# Patient Record
Sex: Male | Born: 1945 | Race: White | Hispanic: No | Marital: Married | Smoking: Current every day smoker
Health system: Southern US, Community
[De-identification: ages and names within clinical notes are randomized; demographics above are authoritative.]

## PROBLEM LIST (undated history)

## (undated) ENCOUNTER — Emergency Department (HOSPITAL_COMMUNITY): Admission: EM | Payer: Self-pay | Source: Home / Self Care

## (undated) DIAGNOSIS — R0789 Other chest pain: Secondary | ICD-10-CM

## (undated) DIAGNOSIS — J9601 Acute respiratory failure with hypoxia: Secondary | ICD-10-CM

## (undated) DIAGNOSIS — I10 Essential (primary) hypertension: Secondary | ICD-10-CM

## (undated) DIAGNOSIS — I2510 Atherosclerotic heart disease of native coronary artery without angina pectoris: Secondary | ICD-10-CM

## (undated) DIAGNOSIS — I509 Heart failure, unspecified: Secondary | ICD-10-CM

## (undated) DIAGNOSIS — F10930 Alcohol use, unspecified with withdrawal, uncomplicated: Secondary | ICD-10-CM

## (undated) DIAGNOSIS — J449 Chronic obstructive pulmonary disease, unspecified: Secondary | ICD-10-CM

## (undated) DIAGNOSIS — J441 Chronic obstructive pulmonary disease with (acute) exacerbation: Secondary | ICD-10-CM

## (undated) DIAGNOSIS — R140 Abdominal distension (gaseous): Secondary | ICD-10-CM

## (undated) DIAGNOSIS — R6510 Systemic inflammatory response syndrome (SIRS) of non-infectious origin without acute organ dysfunction: Secondary | ICD-10-CM

## (undated) DIAGNOSIS — F10230 Alcohol dependence with withdrawal, uncomplicated: Secondary | ICD-10-CM

## (undated) HISTORY — DX: Alcohol use, unspecified with withdrawal, uncomplicated: F10.930

## (undated) HISTORY — DX: Essential (primary) hypertension: I10

## (undated) HISTORY — DX: Systemic inflammatory response syndrome (sirs) of non-infectious origin without acute organ dysfunction: R65.10

## (undated) HISTORY — DX: Atherosclerotic heart disease of native coronary artery without angina pectoris: I25.10

## (undated) HISTORY — PX: CYST REMOVAL NECK: SHX6281

## (undated) HISTORY — DX: Alcohol dependence with withdrawal, uncomplicated: F10.230

## (undated) HISTORY — DX: Chronic obstructive pulmonary disease with (acute) exacerbation: J44.1

## (undated) HISTORY — DX: Abdominal distension (gaseous): R14.0

## (undated) HISTORY — DX: Other chest pain: R07.89

## (undated) HISTORY — DX: Acute respiratory failure with hypoxia: J96.01

---

## 2011-07-22 DIAGNOSIS — R05 Cough: Secondary | ICD-10-CM | POA: Diagnosis not present

## 2011-07-22 DIAGNOSIS — J209 Acute bronchitis, unspecified: Secondary | ICD-10-CM | POA: Diagnosis not present

## 2011-07-22 DIAGNOSIS — J189 Pneumonia, unspecified organism: Secondary | ICD-10-CM | POA: Diagnosis not present

## 2011-10-19 DIAGNOSIS — J4489 Other specified chronic obstructive pulmonary disease: Secondary | ICD-10-CM | POA: Diagnosis not present

## 2011-10-19 DIAGNOSIS — R220 Localized swelling, mass and lump, head: Secondary | ICD-10-CM | POA: Diagnosis not present

## 2011-10-19 DIAGNOSIS — J449 Chronic obstructive pulmonary disease, unspecified: Secondary | ICD-10-CM | POA: Diagnosis not present

## 2011-10-19 DIAGNOSIS — F17200 Nicotine dependence, unspecified, uncomplicated: Secondary | ICD-10-CM | POA: Diagnosis not present

## 2011-10-21 DIAGNOSIS — F17200 Nicotine dependence, unspecified, uncomplicated: Secondary | ICD-10-CM | POA: Diagnosis not present

## 2011-10-21 DIAGNOSIS — R221 Localized swelling, mass and lump, neck: Secondary | ICD-10-CM | POA: Diagnosis not present

## 2011-10-21 DIAGNOSIS — R220 Localized swelling, mass and lump, head: Secondary | ICD-10-CM | POA: Diagnosis not present

## 2011-10-21 DIAGNOSIS — J449 Chronic obstructive pulmonary disease, unspecified: Secondary | ICD-10-CM | POA: Diagnosis not present

## 2011-10-28 DIAGNOSIS — R221 Localized swelling, mass and lump, neck: Secondary | ICD-10-CM | POA: Diagnosis not present

## 2011-10-28 DIAGNOSIS — M5030 Other cervical disc degeneration, unspecified cervical region: Secondary | ICD-10-CM | POA: Diagnosis not present

## 2011-10-28 DIAGNOSIS — R220 Localized swelling, mass and lump, head: Secondary | ICD-10-CM | POA: Diagnosis not present

## 2011-10-28 DIAGNOSIS — M47812 Spondylosis without myelopathy or radiculopathy, cervical region: Secondary | ICD-10-CM | POA: Diagnosis not present

## 2011-11-10 DIAGNOSIS — D1739 Benign lipomatous neoplasm of skin and subcutaneous tissue of other sites: Secondary | ICD-10-CM | POA: Diagnosis not present

## 2011-12-07 DIAGNOSIS — G8911 Acute pain due to trauma: Secondary | ICD-10-CM | POA: Diagnosis not present

## 2011-12-07 DIAGNOSIS — IMO0002 Reserved for concepts with insufficient information to code with codable children: Secondary | ICD-10-CM | POA: Diagnosis not present

## 2011-12-07 DIAGNOSIS — F17200 Nicotine dependence, unspecified, uncomplicated: Secondary | ICD-10-CM | POA: Diagnosis not present

## 2011-12-07 DIAGNOSIS — S139XXA Sprain of joints and ligaments of unspecified parts of neck, initial encounter: Secondary | ICD-10-CM | POA: Diagnosis not present

## 2011-12-14 DIAGNOSIS — R209 Unspecified disturbances of skin sensation: Secondary | ICD-10-CM | POA: Diagnosis not present

## 2011-12-14 DIAGNOSIS — J449 Chronic obstructive pulmonary disease, unspecified: Secondary | ICD-10-CM | POA: Diagnosis not present

## 2011-12-16 DIAGNOSIS — IMO0002 Reserved for concepts with insufficient information to code with codable children: Secondary | ICD-10-CM | POA: Diagnosis not present

## 2011-12-16 DIAGNOSIS — M62838 Other muscle spasm: Secondary | ICD-10-CM | POA: Diagnosis not present

## 2011-12-30 DIAGNOSIS — IMO0002 Reserved for concepts with insufficient information to code with codable children: Secondary | ICD-10-CM | POA: Diagnosis not present

## 2011-12-30 DIAGNOSIS — M62838 Other muscle spasm: Secondary | ICD-10-CM | POA: Diagnosis not present

## 2012-01-05 DIAGNOSIS — IMO0002 Reserved for concepts with insufficient information to code with codable children: Secondary | ICD-10-CM | POA: Diagnosis not present

## 2012-01-05 DIAGNOSIS — M62838 Other muscle spasm: Secondary | ICD-10-CM | POA: Diagnosis not present

## 2012-01-06 DIAGNOSIS — D1779 Benign lipomatous neoplasm of other sites: Secondary | ICD-10-CM | POA: Diagnosis not present

## 2012-01-06 DIAGNOSIS — D1739 Benign lipomatous neoplasm of skin and subcutaneous tissue of other sites: Secondary | ICD-10-CM | POA: Diagnosis not present

## 2012-01-06 DIAGNOSIS — F17200 Nicotine dependence, unspecified, uncomplicated: Secondary | ICD-10-CM | POA: Diagnosis not present

## 2012-01-06 DIAGNOSIS — J449 Chronic obstructive pulmonary disease, unspecified: Secondary | ICD-10-CM | POA: Diagnosis not present

## 2012-01-06 DIAGNOSIS — Z79899 Other long term (current) drug therapy: Secondary | ICD-10-CM | POA: Diagnosis not present

## 2012-01-13 DIAGNOSIS — IMO0002 Reserved for concepts with insufficient information to code with codable children: Secondary | ICD-10-CM | POA: Diagnosis not present

## 2012-01-13 DIAGNOSIS — M62838 Other muscle spasm: Secondary | ICD-10-CM | POA: Diagnosis not present

## 2012-01-20 DIAGNOSIS — IMO0002 Reserved for concepts with insufficient information to code with codable children: Secondary | ICD-10-CM | POA: Diagnosis not present

## 2012-01-20 DIAGNOSIS — M62838 Other muscle spasm: Secondary | ICD-10-CM | POA: Diagnosis not present

## 2012-01-25 DIAGNOSIS — M62838 Other muscle spasm: Secondary | ICD-10-CM | POA: Diagnosis not present

## 2012-01-25 DIAGNOSIS — IMO0002 Reserved for concepts with insufficient information to code with codable children: Secondary | ICD-10-CM | POA: Diagnosis not present

## 2012-02-01 DIAGNOSIS — M62838 Other muscle spasm: Secondary | ICD-10-CM | POA: Diagnosis not present

## 2012-02-01 DIAGNOSIS — IMO0002 Reserved for concepts with insufficient information to code with codable children: Secondary | ICD-10-CM | POA: Diagnosis not present

## 2013-01-16 DIAGNOSIS — Z0000 Encounter for general adult medical examination without abnormal findings: Secondary | ICD-10-CM | POA: Diagnosis not present

## 2013-02-28 DIAGNOSIS — H2510 Age-related nuclear cataract, unspecified eye: Secondary | ICD-10-CM | POA: Diagnosis not present

## 2013-06-01 DIAGNOSIS — J209 Acute bronchitis, unspecified: Secondary | ICD-10-CM | POA: Diagnosis not present

## 2013-07-22 DIAGNOSIS — J449 Chronic obstructive pulmonary disease, unspecified: Secondary | ICD-10-CM | POA: Diagnosis not present

## 2013-07-22 DIAGNOSIS — J189 Pneumonia, unspecified organism: Secondary | ICD-10-CM | POA: Diagnosis not present

## 2013-10-09 DIAGNOSIS — R0609 Other forms of dyspnea: Secondary | ICD-10-CM | POA: Diagnosis not present

## 2013-10-09 DIAGNOSIS — R059 Cough, unspecified: Secondary | ICD-10-CM | POA: Diagnosis not present

## 2013-10-09 DIAGNOSIS — R05 Cough: Secondary | ICD-10-CM | POA: Diagnosis not present

## 2013-11-22 DIAGNOSIS — F17200 Nicotine dependence, unspecified, uncomplicated: Secondary | ICD-10-CM | POA: Diagnosis not present

## 2013-11-22 DIAGNOSIS — J449 Chronic obstructive pulmonary disease, unspecified: Secondary | ICD-10-CM | POA: Diagnosis not present

## 2014-03-29 DIAGNOSIS — J209 Acute bronchitis, unspecified: Secondary | ICD-10-CM | POA: Diagnosis not present

## 2014-11-16 DIAGNOSIS — J45909 Unspecified asthma, uncomplicated: Secondary | ICD-10-CM | POA: Diagnosis not present

## 2014-11-16 DIAGNOSIS — J441 Chronic obstructive pulmonary disease with (acute) exacerbation: Secondary | ICD-10-CM | POA: Diagnosis not present

## 2014-11-16 DIAGNOSIS — R0602 Shortness of breath: Secondary | ICD-10-CM | POA: Diagnosis not present

## 2014-11-16 DIAGNOSIS — R079 Chest pain, unspecified: Secondary | ICD-10-CM | POA: Diagnosis not present

## 2014-11-16 DIAGNOSIS — Z77098 Contact with and (suspected) exposure to other hazardous, chiefly nonmedicinal, chemicals: Secondary | ICD-10-CM | POA: Diagnosis not present

## 2014-11-21 DIAGNOSIS — J9620 Acute and chronic respiratory failure, unspecified whether with hypoxia or hypercapnia: Secondary | ICD-10-CM | POA: Diagnosis not present

## 2014-11-21 DIAGNOSIS — J159 Unspecified bacterial pneumonia: Secondary | ICD-10-CM | POA: Diagnosis not present

## 2014-11-21 DIAGNOSIS — K219 Gastro-esophageal reflux disease without esophagitis: Secondary | ICD-10-CM | POA: Diagnosis not present

## 2014-11-21 DIAGNOSIS — R0689 Other abnormalities of breathing: Secondary | ICD-10-CM | POA: Diagnosis not present

## 2014-11-21 DIAGNOSIS — I2510 Atherosclerotic heart disease of native coronary artery without angina pectoris: Secondary | ICD-10-CM | POA: Diagnosis not present

## 2014-11-21 DIAGNOSIS — M1990 Unspecified osteoarthritis, unspecified site: Secondary | ICD-10-CM | POA: Diagnosis present

## 2014-11-21 DIAGNOSIS — R938 Abnormal findings on diagnostic imaging of other specified body structures: Secondary | ICD-10-CM | POA: Diagnosis not present

## 2014-11-21 DIAGNOSIS — Z87891 Personal history of nicotine dependence: Secondary | ICD-10-CM | POA: Diagnosis not present

## 2014-11-21 DIAGNOSIS — J181 Lobar pneumonia, unspecified organism: Secondary | ICD-10-CM | POA: Diagnosis not present

## 2014-11-21 DIAGNOSIS — J9811 Atelectasis: Secondary | ICD-10-CM | POA: Diagnosis not present

## 2014-11-21 DIAGNOSIS — I700 Atherosclerosis of aorta: Secondary | ICD-10-CM | POA: Diagnosis not present

## 2014-11-21 DIAGNOSIS — I10 Essential (primary) hypertension: Secondary | ICD-10-CM | POA: Diagnosis present

## 2014-11-21 DIAGNOSIS — R0602 Shortness of breath: Secondary | ICD-10-CM | POA: Diagnosis not present

## 2014-11-21 DIAGNOSIS — J9601 Acute respiratory failure with hypoxia: Secondary | ICD-10-CM | POA: Diagnosis not present

## 2014-11-21 DIAGNOSIS — I519 Heart disease, unspecified: Secondary | ICD-10-CM | POA: Diagnosis present

## 2014-11-21 DIAGNOSIS — J441 Chronic obstructive pulmonary disease with (acute) exacerbation: Secondary | ICD-10-CM | POA: Diagnosis not present

## 2014-11-21 DIAGNOSIS — R918 Other nonspecific abnormal finding of lung field: Secondary | ICD-10-CM | POA: Diagnosis not present

## 2014-11-21 DIAGNOSIS — J9809 Other diseases of bronchus, not elsewhere classified: Secondary | ICD-10-CM | POA: Diagnosis present

## 2014-11-21 DIAGNOSIS — R05 Cough: Secondary | ICD-10-CM | POA: Diagnosis not present

## 2014-11-21 DIAGNOSIS — R0902 Hypoxemia: Secondary | ICD-10-CM | POA: Diagnosis not present

## 2014-11-21 DIAGNOSIS — I739 Peripheral vascular disease, unspecified: Secondary | ICD-10-CM | POA: Diagnosis present

## 2014-11-21 DIAGNOSIS — Z79899 Other long term (current) drug therapy: Secondary | ICD-10-CM | POA: Diagnosis not present

## 2014-11-21 DIAGNOSIS — Z9981 Dependence on supplemental oxygen: Secondary | ICD-10-CM | POA: Diagnosis not present

## 2014-11-25 DIAGNOSIS — J9620 Acute and chronic respiratory failure, unspecified whether with hypoxia or hypercapnia: Secondary | ICD-10-CM | POA: Diagnosis not present

## 2014-11-25 DIAGNOSIS — J159 Unspecified bacterial pneumonia: Secondary | ICD-10-CM | POA: Diagnosis not present

## 2014-11-25 DIAGNOSIS — R938 Abnormal findings on diagnostic imaging of other specified body structures: Secondary | ICD-10-CM | POA: Diagnosis not present

## 2014-11-25 DIAGNOSIS — Z9981 Dependence on supplemental oxygen: Secondary | ICD-10-CM | POA: Diagnosis not present

## 2014-11-25 DIAGNOSIS — R0902 Hypoxemia: Secondary | ICD-10-CM | POA: Diagnosis not present

## 2014-11-25 DIAGNOSIS — J441 Chronic obstructive pulmonary disease with (acute) exacerbation: Secondary | ICD-10-CM | POA: Diagnosis not present

## 2014-11-25 DIAGNOSIS — J9601 Acute respiratory failure with hypoxia: Secondary | ICD-10-CM | POA: Diagnosis not present

## 2014-12-03 DIAGNOSIS — J449 Chronic obstructive pulmonary disease, unspecified: Secondary | ICD-10-CM | POA: Diagnosis not present

## 2014-12-03 DIAGNOSIS — F17210 Nicotine dependence, cigarettes, uncomplicated: Secondary | ICD-10-CM | POA: Diagnosis not present

## 2014-12-03 DIAGNOSIS — R918 Other nonspecific abnormal finding of lung field: Secondary | ICD-10-CM | POA: Diagnosis not present

## 2014-12-04 DIAGNOSIS — F17200 Nicotine dependence, unspecified, uncomplicated: Secondary | ICD-10-CM | POA: Diagnosis not present

## 2014-12-04 DIAGNOSIS — Z6827 Body mass index (BMI) 27.0-27.9, adult: Secondary | ICD-10-CM | POA: Diagnosis not present

## 2014-12-04 DIAGNOSIS — J441 Chronic obstructive pulmonary disease with (acute) exacerbation: Secondary | ICD-10-CM | POA: Diagnosis not present

## 2014-12-04 DIAGNOSIS — R938 Abnormal findings on diagnostic imaging of other specified body structures: Secondary | ICD-10-CM | POA: Diagnosis not present

## 2014-12-04 DIAGNOSIS — J159 Unspecified bacterial pneumonia: Secondary | ICD-10-CM | POA: Diagnosis not present

## 2014-12-04 DIAGNOSIS — I739 Peripheral vascular disease, unspecified: Secondary | ICD-10-CM | POA: Diagnosis not present

## 2014-12-04 DIAGNOSIS — K219 Gastro-esophageal reflux disease without esophagitis: Secondary | ICD-10-CM | POA: Diagnosis not present

## 2014-12-04 DIAGNOSIS — Z09 Encounter for follow-up examination after completed treatment for conditions other than malignant neoplasm: Secondary | ICD-10-CM | POA: Diagnosis not present

## 2014-12-15 DIAGNOSIS — I2510 Atherosclerotic heart disease of native coronary artery without angina pectoris: Secondary | ICD-10-CM | POA: Diagnosis not present

## 2014-12-15 DIAGNOSIS — R911 Solitary pulmonary nodule: Secondary | ICD-10-CM | POA: Diagnosis not present

## 2014-12-15 DIAGNOSIS — J432 Centrilobular emphysema: Secondary | ICD-10-CM | POA: Diagnosis not present

## 2014-12-15 DIAGNOSIS — J9809 Other diseases of bronchus, not elsewhere classified: Secondary | ICD-10-CM | POA: Diagnosis not present

## 2014-12-15 DIAGNOSIS — J439 Emphysema, unspecified: Secondary | ICD-10-CM | POA: Diagnosis not present

## 2014-12-17 DIAGNOSIS — R918 Other nonspecific abnormal finding of lung field: Secondary | ICD-10-CM | POA: Diagnosis not present

## 2014-12-17 DIAGNOSIS — J449 Chronic obstructive pulmonary disease, unspecified: Secondary | ICD-10-CM | POA: Diagnosis not present

## 2014-12-17 DIAGNOSIS — F17210 Nicotine dependence, cigarettes, uncomplicated: Secondary | ICD-10-CM | POA: Diagnosis not present

## 2015-01-14 DIAGNOSIS — R911 Solitary pulmonary nodule: Secondary | ICD-10-CM | POA: Diagnosis not present

## 2015-01-14 DIAGNOSIS — F17210 Nicotine dependence, cigarettes, uncomplicated: Secondary | ICD-10-CM | POA: Diagnosis not present

## 2015-01-21 DIAGNOSIS — J9809 Other diseases of bronchus, not elsewhere classified: Secondary | ICD-10-CM | POA: Diagnosis not present

## 2015-01-21 DIAGNOSIS — F17200 Nicotine dependence, unspecified, uncomplicated: Secondary | ICD-10-CM | POA: Diagnosis not present

## 2015-01-21 DIAGNOSIS — R0902 Hypoxemia: Secondary | ICD-10-CM | POA: Diagnosis not present

## 2015-01-21 DIAGNOSIS — J449 Chronic obstructive pulmonary disease, unspecified: Secondary | ICD-10-CM | POA: Diagnosis not present

## 2015-01-21 DIAGNOSIS — J1100 Influenza due to unidentified influenza virus with unspecified type of pneumonia: Secondary | ICD-10-CM | POA: Diagnosis not present

## 2015-01-21 DIAGNOSIS — J852 Abscess of lung without pneumonia: Secondary | ICD-10-CM | POA: Diagnosis not present

## 2015-02-03 DIAGNOSIS — J449 Chronic obstructive pulmonary disease, unspecified: Secondary | ICD-10-CM | POA: Diagnosis not present

## 2015-02-03 DIAGNOSIS — R918 Other nonspecific abnormal finding of lung field: Secondary | ICD-10-CM | POA: Diagnosis not present

## 2015-03-10 DIAGNOSIS — R918 Other nonspecific abnormal finding of lung field: Secondary | ICD-10-CM | POA: Diagnosis not present

## 2015-03-10 DIAGNOSIS — J9809 Other diseases of bronchus, not elsewhere classified: Secondary | ICD-10-CM | POA: Diagnosis not present

## 2015-03-10 DIAGNOSIS — J449 Chronic obstructive pulmonary disease, unspecified: Secondary | ICD-10-CM | POA: Diagnosis not present

## 2015-03-10 DIAGNOSIS — R911 Solitary pulmonary nodule: Secondary | ICD-10-CM | POA: Diagnosis not present

## 2015-03-10 DIAGNOSIS — F17200 Nicotine dependence, unspecified, uncomplicated: Secondary | ICD-10-CM | POA: Diagnosis not present

## 2015-03-10 DIAGNOSIS — J9811 Atelectasis: Secondary | ICD-10-CM | POA: Diagnosis not present

## 2015-03-17 DIAGNOSIS — J441 Chronic obstructive pulmonary disease with (acute) exacerbation: Secondary | ICD-10-CM | POA: Diagnosis not present

## 2015-03-17 DIAGNOSIS — Z6829 Body mass index (BMI) 29.0-29.9, adult: Secondary | ICD-10-CM | POA: Diagnosis not present

## 2015-03-18 DIAGNOSIS — R05 Cough: Secondary | ICD-10-CM | POA: Diagnosis not present

## 2015-03-18 DIAGNOSIS — J441 Chronic obstructive pulmonary disease with (acute) exacerbation: Secondary | ICD-10-CM | POA: Diagnosis not present

## 2015-04-03 DIAGNOSIS — R918 Other nonspecific abnormal finding of lung field: Secondary | ICD-10-CM | POA: Diagnosis not present

## 2015-04-03 DIAGNOSIS — J449 Chronic obstructive pulmonary disease, unspecified: Secondary | ICD-10-CM | POA: Diagnosis not present

## 2015-04-03 DIAGNOSIS — F17210 Nicotine dependence, cigarettes, uncomplicated: Secondary | ICD-10-CM | POA: Diagnosis not present

## 2015-06-10 DIAGNOSIS — Z6828 Body mass index (BMI) 28.0-28.9, adult: Secondary | ICD-10-CM | POA: Diagnosis not present

## 2015-06-10 DIAGNOSIS — J441 Chronic obstructive pulmonary disease with (acute) exacerbation: Secondary | ICD-10-CM | POA: Diagnosis not present

## 2015-07-10 DIAGNOSIS — F17210 Nicotine dependence, cigarettes, uncomplicated: Secondary | ICD-10-CM | POA: Diagnosis not present

## 2015-07-10 DIAGNOSIS — R918 Other nonspecific abnormal finding of lung field: Secondary | ICD-10-CM | POA: Diagnosis not present

## 2015-07-10 DIAGNOSIS — J449 Chronic obstructive pulmonary disease, unspecified: Secondary | ICD-10-CM | POA: Diagnosis not present

## 2015-08-04 DIAGNOSIS — R509 Fever, unspecified: Secondary | ICD-10-CM | POA: Diagnosis not present

## 2015-08-04 DIAGNOSIS — J189 Pneumonia, unspecified organism: Secondary | ICD-10-CM | POA: Diagnosis not present

## 2015-08-31 DIAGNOSIS — J040 Acute laryngitis: Secondary | ICD-10-CM | POA: Diagnosis not present

## 2015-10-23 DIAGNOSIS — J449 Chronic obstructive pulmonary disease, unspecified: Secondary | ICD-10-CM | POA: Diagnosis not present

## 2015-10-23 DIAGNOSIS — F17210 Nicotine dependence, cigarettes, uncomplicated: Secondary | ICD-10-CM | POA: Diagnosis not present

## 2015-10-23 DIAGNOSIS — R918 Other nonspecific abnormal finding of lung field: Secondary | ICD-10-CM | POA: Diagnosis not present

## 2015-11-26 DIAGNOSIS — R942 Abnormal results of pulmonary function studies: Secondary | ICD-10-CM | POA: Diagnosis not present

## 2016-02-10 DIAGNOSIS — J209 Acute bronchitis, unspecified: Secondary | ICD-10-CM | POA: Diagnosis not present

## 2016-02-10 DIAGNOSIS — J0100 Acute maxillary sinusitis, unspecified: Secondary | ICD-10-CM | POA: Diagnosis not present

## 2016-02-10 DIAGNOSIS — R509 Fever, unspecified: Secondary | ICD-10-CM | POA: Diagnosis not present

## 2016-04-06 DIAGNOSIS — R918 Other nonspecific abnormal finding of lung field: Secondary | ICD-10-CM | POA: Diagnosis not present

## 2016-04-06 DIAGNOSIS — J439 Emphysema, unspecified: Secondary | ICD-10-CM | POA: Diagnosis not present

## 2016-04-08 DIAGNOSIS — F17210 Nicotine dependence, cigarettes, uncomplicated: Secondary | ICD-10-CM | POA: Diagnosis not present

## 2016-04-08 DIAGNOSIS — R918 Other nonspecific abnormal finding of lung field: Secondary | ICD-10-CM | POA: Diagnosis not present

## 2016-04-08 DIAGNOSIS — J449 Chronic obstructive pulmonary disease, unspecified: Secondary | ICD-10-CM | POA: Diagnosis not present

## 2016-06-07 DIAGNOSIS — J111 Influenza due to unidentified influenza virus with other respiratory manifestations: Secondary | ICD-10-CM | POA: Diagnosis not present

## 2016-06-14 DIAGNOSIS — J432 Centrilobular emphysema: Secondary | ICD-10-CM | POA: Diagnosis not present

## 2016-06-14 DIAGNOSIS — J441 Chronic obstructive pulmonary disease with (acute) exacerbation: Secondary | ICD-10-CM | POA: Diagnosis not present

## 2016-06-14 DIAGNOSIS — R918 Other nonspecific abnormal finding of lung field: Secondary | ICD-10-CM | POA: Diagnosis not present

## 2016-06-21 DIAGNOSIS — J441 Chronic obstructive pulmonary disease with (acute) exacerbation: Secondary | ICD-10-CM | POA: Diagnosis not present

## 2016-06-21 DIAGNOSIS — Z87891 Personal history of nicotine dependence: Secondary | ICD-10-CM | POA: Diagnosis not present

## 2016-06-28 DIAGNOSIS — J449 Chronic obstructive pulmonary disease, unspecified: Secondary | ICD-10-CM | POA: Diagnosis not present

## 2016-06-28 DIAGNOSIS — I110 Hypertensive heart disease with heart failure: Secondary | ICD-10-CM | POA: Diagnosis not present

## 2016-06-28 DIAGNOSIS — Z77098 Contact with and (suspected) exposure to other hazardous, chiefly nonmedicinal, chemicals: Secondary | ICD-10-CM | POA: Diagnosis present

## 2016-06-28 DIAGNOSIS — F17210 Nicotine dependence, cigarettes, uncomplicated: Secondary | ICD-10-CM | POA: Diagnosis present

## 2016-06-28 DIAGNOSIS — L723 Sebaceous cyst: Secondary | ICD-10-CM | POA: Diagnosis not present

## 2016-06-28 DIAGNOSIS — I5023 Acute on chronic systolic (congestive) heart failure: Secondary | ICD-10-CM | POA: Diagnosis present

## 2016-06-28 DIAGNOSIS — J441 Chronic obstructive pulmonary disease with (acute) exacerbation: Secondary | ICD-10-CM | POA: Diagnosis present

## 2016-06-28 DIAGNOSIS — R093 Abnormal sputum: Secondary | ICD-10-CM | POA: Diagnosis not present

## 2016-06-28 DIAGNOSIS — R0602 Shortness of breath: Secondary | ICD-10-CM | POA: Diagnosis not present

## 2016-06-28 DIAGNOSIS — Z8701 Personal history of pneumonia (recurrent): Secondary | ICD-10-CM | POA: Diagnosis not present

## 2016-06-28 DIAGNOSIS — Z8619 Personal history of other infectious and parasitic diseases: Secondary | ICD-10-CM | POA: Diagnosis not present

## 2016-06-28 DIAGNOSIS — R918 Other nonspecific abnormal finding of lung field: Secondary | ICD-10-CM | POA: Diagnosis not present

## 2016-06-28 DIAGNOSIS — R000 Tachycardia, unspecified: Secondary | ICD-10-CM | POA: Diagnosis not present

## 2016-06-28 DIAGNOSIS — Z720 Tobacco use: Secondary | ICD-10-CM | POA: Diagnosis not present

## 2016-06-28 DIAGNOSIS — Z7951 Long term (current) use of inhaled steroids: Secondary | ICD-10-CM | POA: Diagnosis not present

## 2016-06-28 DIAGNOSIS — R0600 Dyspnea, unspecified: Secondary | ICD-10-CM | POA: Diagnosis not present

## 2016-06-28 DIAGNOSIS — I5030 Unspecified diastolic (congestive) heart failure: Secondary | ICD-10-CM | POA: Diagnosis not present

## 2016-06-28 DIAGNOSIS — Z8709 Personal history of other diseases of the respiratory system: Secondary | ICD-10-CM | POA: Diagnosis not present

## 2016-07-04 DIAGNOSIS — L723 Sebaceous cyst: Secondary | ICD-10-CM | POA: Diagnosis not present

## 2016-07-04 DIAGNOSIS — F17210 Nicotine dependence, cigarettes, uncomplicated: Secondary | ICD-10-CM | POA: Diagnosis not present

## 2016-07-08 DIAGNOSIS — Z9181 History of falling: Secondary | ICD-10-CM | POA: Diagnosis not present

## 2016-07-08 DIAGNOSIS — R062 Wheezing: Secondary | ICD-10-CM | POA: Diagnosis not present

## 2016-07-08 DIAGNOSIS — E663 Overweight: Secondary | ICD-10-CM | POA: Diagnosis not present

## 2016-07-08 DIAGNOSIS — Z1389 Encounter for screening for other disorder: Secondary | ICD-10-CM | POA: Diagnosis not present

## 2016-07-08 DIAGNOSIS — Z6827 Body mass index (BMI) 27.0-27.9, adult: Secondary | ICD-10-CM | POA: Diagnosis not present

## 2016-07-08 DIAGNOSIS — L720 Epidermal cyst: Secondary | ICD-10-CM | POA: Diagnosis not present

## 2016-07-08 DIAGNOSIS — L723 Sebaceous cyst: Secondary | ICD-10-CM | POA: Diagnosis not present

## 2016-07-08 DIAGNOSIS — Z79899 Other long term (current) drug therapy: Secondary | ICD-10-CM | POA: Diagnosis not present

## 2016-07-11 DIAGNOSIS — J441 Chronic obstructive pulmonary disease with (acute) exacerbation: Secondary | ICD-10-CM | POA: Diagnosis not present

## 2016-07-11 DIAGNOSIS — Z9981 Dependence on supplemental oxygen: Secondary | ICD-10-CM | POA: Diagnosis not present

## 2016-07-11 DIAGNOSIS — R6883 Chills (without fever): Secondary | ICD-10-CM | POA: Diagnosis not present

## 2016-07-11 DIAGNOSIS — J9621 Acute and chronic respiratory failure with hypoxia: Secondary | ICD-10-CM | POA: Diagnosis not present

## 2016-07-11 DIAGNOSIS — R911 Solitary pulmonary nodule: Secondary | ICD-10-CM | POA: Diagnosis not present

## 2016-07-11 DIAGNOSIS — F17210 Nicotine dependence, cigarettes, uncomplicated: Secondary | ICD-10-CM | POA: Diagnosis not present

## 2016-07-11 DIAGNOSIS — I5032 Chronic diastolic (congestive) heart failure: Secondary | ICD-10-CM | POA: Diagnosis not present

## 2016-07-11 DIAGNOSIS — R42 Dizziness and giddiness: Secondary | ICD-10-CM | POA: Diagnosis not present

## 2016-07-11 DIAGNOSIS — R0602 Shortness of breath: Secondary | ICD-10-CM | POA: Diagnosis not present

## 2016-07-11 DIAGNOSIS — R069 Unspecified abnormalities of breathing: Secondary | ICD-10-CM | POA: Diagnosis not present

## 2016-07-12 DIAGNOSIS — J111 Influenza due to unidentified influenza virus with other respiratory manifestations: Secondary | ICD-10-CM | POA: Diagnosis present

## 2016-07-12 DIAGNOSIS — R000 Tachycardia, unspecified: Secondary | ICD-10-CM | POA: Diagnosis not present

## 2016-07-12 DIAGNOSIS — Z7951 Long term (current) use of inhaled steroids: Secondary | ICD-10-CM | POA: Diagnosis not present

## 2016-07-12 DIAGNOSIS — J151 Pneumonia due to Pseudomonas: Secondary | ICD-10-CM | POA: Diagnosis not present

## 2016-07-12 DIAGNOSIS — Z8709 Personal history of other diseases of the respiratory system: Secondary | ICD-10-CM | POA: Diagnosis not present

## 2016-07-12 DIAGNOSIS — I5031 Acute diastolic (congestive) heart failure: Secondary | ICD-10-CM | POA: Diagnosis not present

## 2016-07-12 DIAGNOSIS — R911 Solitary pulmonary nodule: Secondary | ICD-10-CM | POA: Diagnosis present

## 2016-07-12 DIAGNOSIS — R0602 Shortness of breath: Secondary | ICD-10-CM | POA: Diagnosis not present

## 2016-07-12 DIAGNOSIS — J9601 Acute respiratory failure with hypoxia: Secondary | ICD-10-CM | POA: Diagnosis not present

## 2016-07-12 DIAGNOSIS — F17210 Nicotine dependence, cigarettes, uncomplicated: Secondary | ICD-10-CM | POA: Diagnosis not present

## 2016-07-12 DIAGNOSIS — I5032 Chronic diastolic (congestive) heart failure: Secondary | ICD-10-CM | POA: Diagnosis present

## 2016-07-12 DIAGNOSIS — Z9981 Dependence on supplemental oxygen: Secondary | ICD-10-CM | POA: Diagnosis not present

## 2016-07-12 DIAGNOSIS — J449 Chronic obstructive pulmonary disease, unspecified: Secondary | ICD-10-CM | POA: Diagnosis not present

## 2016-07-12 DIAGNOSIS — J8410 Pulmonary fibrosis, unspecified: Secondary | ICD-10-CM | POA: Diagnosis not present

## 2016-07-12 DIAGNOSIS — T380X5A Adverse effect of glucocorticoids and synthetic analogues, initial encounter: Secondary | ICD-10-CM | POA: Diagnosis present

## 2016-07-12 DIAGNOSIS — R739 Hyperglycemia, unspecified: Secondary | ICD-10-CM | POA: Diagnosis not present

## 2016-07-12 DIAGNOSIS — J9621 Acute and chronic respiratory failure with hypoxia: Secondary | ICD-10-CM | POA: Diagnosis not present

## 2016-07-12 DIAGNOSIS — J441 Chronic obstructive pulmonary disease with (acute) exacerbation: Secondary | ICD-10-CM | POA: Diagnosis not present

## 2016-07-26 DIAGNOSIS — Z9981 Dependence on supplemental oxygen: Secondary | ICD-10-CM | POA: Diagnosis not present

## 2016-07-26 DIAGNOSIS — Z7951 Long term (current) use of inhaled steroids: Secondary | ICD-10-CM | POA: Diagnosis not present

## 2016-07-26 DIAGNOSIS — J432 Centrilobular emphysema: Secondary | ICD-10-CM | POA: Diagnosis not present

## 2016-07-26 DIAGNOSIS — J449 Chronic obstructive pulmonary disease, unspecified: Secondary | ICD-10-CM | POA: Diagnosis not present

## 2016-07-26 DIAGNOSIS — F17210 Nicotine dependence, cigarettes, uncomplicated: Secondary | ICD-10-CM | POA: Diagnosis not present

## 2016-07-28 DIAGNOSIS — Z09 Encounter for follow-up examination after completed treatment for conditions other than malignant neoplasm: Secondary | ICD-10-CM | POA: Diagnosis not present

## 2016-07-28 DIAGNOSIS — E663 Overweight: Secondary | ICD-10-CM | POA: Diagnosis not present

## 2016-07-28 DIAGNOSIS — Z6828 Body mass index (BMI) 28.0-28.9, adult: Secondary | ICD-10-CM | POA: Diagnosis not present

## 2016-08-23 DIAGNOSIS — E669 Obesity, unspecified: Secondary | ICD-10-CM | POA: Diagnosis not present

## 2016-08-23 DIAGNOSIS — R0789 Other chest pain: Secondary | ICD-10-CM | POA: Diagnosis not present

## 2016-08-23 DIAGNOSIS — M7989 Other specified soft tissue disorders: Secondary | ICD-10-CM | POA: Diagnosis not present

## 2016-08-23 DIAGNOSIS — R062 Wheezing: Secondary | ICD-10-CM | POA: Diagnosis not present

## 2016-08-23 DIAGNOSIS — Z6830 Body mass index (BMI) 30.0-30.9, adult: Secondary | ICD-10-CM | POA: Diagnosis not present

## 2016-08-23 DIAGNOSIS — R0602 Shortness of breath: Secondary | ICD-10-CM | POA: Diagnosis not present

## 2016-09-27 ENCOUNTER — Emergency Department (HOSPITAL_COMMUNITY): Payer: BLUE CROSS/BLUE SHIELD

## 2016-09-27 ENCOUNTER — Inpatient Hospital Stay (HOSPITAL_COMMUNITY)
Admission: EM | Admit: 2016-09-27 | Discharge: 2016-09-30 | DRG: 190 | Disposition: A | Discharge status: Home Health | Payer: BLUE CROSS/BLUE SHIELD | Attending: Internal Medicine | Admitting: Internal Medicine

## 2016-09-27 ENCOUNTER — Encounter (HOSPITAL_COMMUNITY): Payer: Self-pay

## 2016-09-27 DIAGNOSIS — J9621 Acute and chronic respiratory failure with hypoxia: Secondary | ICD-10-CM | POA: Diagnosis present

## 2016-09-27 DIAGNOSIS — J441 Chronic obstructive pulmonary disease with (acute) exacerbation: Principal | ICD-10-CM | POA: Diagnosis present

## 2016-09-27 DIAGNOSIS — Z8249 Family history of ischemic heart disease and other diseases of the circulatory system: Secondary | ICD-10-CM | POA: Diagnosis not present

## 2016-09-27 DIAGNOSIS — Z79899 Other long term (current) drug therapy: Secondary | ICD-10-CM | POA: Diagnosis not present

## 2016-09-27 DIAGNOSIS — I10 Essential (primary) hypertension: Secondary | ICD-10-CM | POA: Diagnosis present

## 2016-09-27 DIAGNOSIS — F17210 Nicotine dependence, cigarettes, uncomplicated: Secondary | ICD-10-CM | POA: Diagnosis present

## 2016-09-27 DIAGNOSIS — Z9981 Dependence on supplemental oxygen: Secondary | ICD-10-CM | POA: Diagnosis not present

## 2016-09-27 DIAGNOSIS — I110 Hypertensive heart disease with heart failure: Secondary | ICD-10-CM | POA: Diagnosis present

## 2016-09-27 DIAGNOSIS — R069 Unspecified abnormalities of breathing: Secondary | ICD-10-CM | POA: Diagnosis not present

## 2016-09-27 DIAGNOSIS — R0602 Shortness of breath: Secondary | ICD-10-CM | POA: Diagnosis not present

## 2016-09-27 DIAGNOSIS — I5033 Acute on chronic diastolic (congestive) heart failure: Secondary | ICD-10-CM | POA: Diagnosis present

## 2016-09-27 DIAGNOSIS — Z833 Family history of diabetes mellitus: Secondary | ICD-10-CM

## 2016-09-27 DIAGNOSIS — J9601 Acute respiratory failure with hypoxia: Secondary | ICD-10-CM | POA: Diagnosis not present

## 2016-09-27 DIAGNOSIS — F1010 Alcohol abuse, uncomplicated: Secondary | ICD-10-CM | POA: Diagnosis present

## 2016-09-27 DIAGNOSIS — I5032 Chronic diastolic (congestive) heart failure: Secondary | ICD-10-CM

## 2016-09-27 HISTORY — DX: Chronic obstructive pulmonary disease with (acute) exacerbation: J44.1

## 2016-09-27 HISTORY — DX: Essential (primary) hypertension: I10

## 2016-09-27 HISTORY — DX: Acute respiratory failure with hypoxia: J96.01

## 2016-09-27 HISTORY — DX: Heart failure, unspecified: I50.9

## 2016-09-27 HISTORY — DX: Chronic obstructive pulmonary disease, unspecified: J44.9

## 2016-09-27 LAB — BASIC METABOLIC PANEL
Anion gap: 13 (ref 5–15)
BUN: 18 mg/dL (ref 6–20)
CALCIUM: 9 mg/dL (ref 8.9–10.3)
CO2: 26 mmol/L (ref 22–32)
CREATININE: 0.76 mg/dL (ref 0.61–1.24)
Chloride: 99 mmol/L — ABNORMAL LOW (ref 101–111)
GFR calc Af Amer: >60 mL/min (ref >60)
GFR calc non Af Amer: >60 mL/min (ref >60)
GLUCOSE: 128 mg/dL — AB (ref 65–99)
Potassium: 3.5 mmol/L (ref 3.5–5.1)
Sodium: 138 mmol/L (ref 135–145)

## 2016-09-27 LAB — BRAIN NATRIURETIC PEPTIDE: B Natriuretic Peptide: 77.5 pg/mL (ref 0.0–100.0)

## 2016-09-27 LAB — TROPONIN I: Troponin I: <0.03 ng/mL (ref <0.03)

## 2016-09-27 LAB — CBC
HEMATOCRIT: 39.9 % (ref 39.0–52.0)
Hemoglobin: 13.3 g/dL (ref 13.0–17.0)
MCH: 33.5 pg (ref 26.0–34.0)
MCHC: 33.3 g/dL (ref 30.0–36.0)
MCV: 100.5 fL — AB (ref 78.0–100.0)
Platelets: 212 10*3/uL (ref 150–400)
RBC: 3.97 MIL/uL — ABNORMAL LOW (ref 4.22–5.81)
RDW: 12.7 % (ref 11.5–15.5)
WBC: 7.1 10*3/uL (ref 4.0–10.5)

## 2016-09-27 LAB — D-DIMER, QUANTITATIVE: D-Dimer, Quant: 2.53 ug/mL-FEU — ABNORMAL HIGH (ref 0.00–0.50)

## 2016-09-27 MED ORDER — ACETAMINOPHEN 325 MG PO TABS: 650.0000 mg | ORAL_TABLET | Freq: Four times a day (QID) | ORAL | Status: DC | PRN

## 2016-09-27 MED ORDER — ONDANSETRON HCL 4 MG PO TABS: 4.0000 mg | ORAL_TABLET | Freq: Four times a day (QID) | ORAL | Status: DC | PRN

## 2016-09-27 MED ORDER — FUROSEMIDE 10 MG/ML IJ SOLN
40.0000 mg | Freq: Two times a day (BID) | INTRAMUSCULAR | Status: DC
  Administered 2016-09-28: 40 mg via INTRAVENOUS
  Filled 2016-09-27: qty 4

## 2016-09-27 MED ORDER — IPRATROPIUM-ALBUTEROL 0.5-2.5 (3) MG/3ML IN SOLN
3.0000 mL | Freq: Once | RESPIRATORY_TRACT | Status: AC
  Administered 2016-09-27: 3 mL via RESPIRATORY_TRACT
  Filled 2016-09-27: qty 3

## 2016-09-27 MED ORDER — METHYLPREDNISOLONE SODIUM SUCC 125 MG IJ SOLR
60.0000 mg | Freq: Two times a day (BID) | INTRAMUSCULAR | Status: DC
  Administered 2016-09-27 – 2016-09-28 (×3): 60 mg via INTRAVENOUS
  Filled 2016-09-27 (×3): qty 2

## 2016-09-27 MED ORDER — POTASSIUM CHLORIDE CRYS ER 20 MEQ PO TBCR
40.0000 meq | EXTENDED_RELEASE_TABLET | Freq: Two times a day (BID) | ORAL | Status: DC
  Administered 2016-09-27 – 2016-09-30 (×6): 40 meq via ORAL
  Filled 2016-09-27 (×6): qty 2

## 2016-09-27 MED ORDER — ONDANSETRON HCL 4 MG/2ML IJ SOLN: 4.0000 mg | Freq: Four times a day (QID) | INTRAMUSCULAR | Status: DC | PRN

## 2016-09-27 MED ORDER — IPRATROPIUM-ALBUTEROL 0.5-2.5 (3) MG/3ML IN SOLN
3.0000 mL | RESPIRATORY_TRACT | Status: DC | PRN
  Administered 2016-09-27 – 2016-09-30 (×4): 3 mL via RESPIRATORY_TRACT
  Filled 2016-09-27 (×5): qty 3

## 2016-09-27 MED ORDER — FUROSEMIDE 10 MG/ML IJ SOLN
20.0000 mg | Freq: Once | INTRAMUSCULAR | Status: AC
  Administered 2016-09-27: 20 mg via INTRAVENOUS
  Filled 2016-09-27: qty 2

## 2016-09-27 MED ORDER — LOSARTAN POTASSIUM 50 MG PO TABS
50.0000 mg | ORAL_TABLET | ORAL | Status: DC
  Administered 2016-09-28 – 2016-09-30 (×3): 50 mg via ORAL
  Filled 2016-09-27 (×3): qty 1

## 2016-09-27 MED ORDER — ENOXAPARIN SODIUM 40 MG/0.4ML ~~LOC~~ SOLN
40.0000 mg | SUBCUTANEOUS | Status: DC
  Administered 2016-09-27 – 2016-09-29 (×3): 40 mg via SUBCUTANEOUS
  Filled 2016-09-27 (×3): qty 0.4

## 2016-09-27 MED ORDER — ACETAMINOPHEN 650 MG RE SUPP: 650.0000 mg | Freq: Four times a day (QID) | RECTAL | Status: DC | PRN

## 2016-09-27 MED ORDER — IOPAMIDOL (ISOVUE-370) INJECTION 76%
INTRAVENOUS | Status: AC
  Administered 2016-09-27: 100 mL
  Filled 2016-09-27: qty 100

## 2016-09-27 MED ORDER — IPRATROPIUM-ALBUTEROL 0.5-2.5 (3) MG/3ML IN SOLN
3.0000 mL | Freq: Four times a day (QID) | RESPIRATORY_TRACT | Status: DC
  Administered 2016-09-27 – 2016-09-28 (×3): 3 mL via RESPIRATORY_TRACT
  Filled 2016-09-27 (×3): qty 3

## 2016-09-27 NOTE — H&P (Signed)
History and Physical    Gabriel Stafford TDV:[id number] DOB: Jan 23, 1946 DOA: 09/27/2016  Referring MD/NP/PA: EDP PCP:  Patient coming from: Home  Chief Complaint: shortness of breath  HPI: Gabriel Stafford is a 71 y.o. male with medical history significant of COPD/chronic resp failure on 4L Home O2, ongoing tobacco use, Chronic diastolic CHF, HTN was recently discharged from [facility] after admission for COPD  and diastolic CHF, he was treated with steroids, lasix and discharged home. He then saw his PCP in the last 2weeks his BP med was changed to Cardizem 180mg  he was concerned after reading the side effects and went back to see his doctor who then changed the Cardizem to Losartan. He noted acute onset shortness of breath today associated with leg swelling, some Orthopnea, abd wall tightness and wheezing. He denies any fevers or chills ED Course: Felt to have CHF exacerbation-given nebs and lasix 20mg  x1, d--dimer elevated to 2.5, CTA chest  Negative for PE or acute findings  Review of Systems: As per HPI otherwise 10 point review of systems negative.   Past Medical History:  Diagnosis Date  . CHF (congestive heart failure) ([place])   . COPD (chronic obstructive pulmonary disease) ([place])     Past Surgical History:  Procedure Laterality Date  . CYST REMOVAL NECK       reports that he has been smoking Cigarettes.  He has never used smokeless tobacco. He reports that he drinks alcohol. He reports that he does not use drugs.  No Known Allergies   family history  -positive for CHF/DM   Prior to Admission medications   Medication Sig Start Date End Date Taking? Authorizing Provider  ARNUITY ELLIPTA 200 MCG/ACT AEPB Take 1 puff by mouth every morning. 09/14/16  Yes Historical Provider, MD  furosemide (LASIX) 20 MG tablet Take 20-40 mg by mouth 2 (two) times daily. Take 2 tablets in the morning, and 1 tablet in the evening 09/07/16  Yes Historical Provider, MD  ipratropium-albuterol (DUONEB)  0.5-2.5 (3) MG/3ML SOLN Take 3 mLs by nebulization every 4 (four) hours as needed for wheezing or shortness of breath. 09/07/16  Yes Historical Provider, MD  losartan (COZAAR) 50 MG tablet Take 50 mg by mouth every morning. 09/15/16  Yes Historical Provider, MD  STIOLTO RESPIMAT 2.5-2.5 MCG/ACT AERS Take 2 puffs by mouth every morning. 09/14/16  Yes Historical Provider, MD    Physical Exam: Vitals:   09/27/16 1530 09/27/16 1615 09/27/16 1630 09/27/16 1645  BP: 126/69 126/74 122/77 130/75  Pulse: (!) 114 (!) 121 (!) 120 (!) 117  Resp: 20 (!) 30 (!) 21   Temp:      TempSrc:      SpO2: 98% 97% 99% 95%  Weight:      Height:          Constitutional: NAD, calm, uncomfortable, only mild distress Vitals:   09/27/16 1530 09/27/16 1615 09/27/16 1630 09/27/16 1645  BP: 126/69 126/74 122/77 130/75  Pulse: (!) 114 (!) 121 (!) 120 (!) 117  Resp: 20 (!) 30 (!) 21   Temp:      TempSrc:      SpO2: 98% 97% 99% 95%  Weight:      Height:       Eyes: PERRL, lids and conjunctivae normal ENMT: Mucous membranes are moist. normal dentition.  Neck: normal, supple, no masses, no thyromegaly Respiratory: poor air movement, rare exp wheezes and fine basilar crackles Cardiovascular: Regular rate and rhythm, no murmurs / rubs / gallops. Abdomen: soft  NT, distended, Bowel sounds positive.  Musculoskeletal: + clubbing Ext: 1-2plus edema extending to knee Skin: no rashes, lesions, ulcers. No induration Neurologic: CN 2-12 grossly intact. Sensation intact, DTR normal. Strength 5/5 in all 4.  Psychiatric: Normal judgment and insight. Alert and oriented x 3. Normal mood.   Labs on Admission: I have personally reviewed following labs and imaging studies  CBC:  Recent Labs Lab 09/27/16 1119  WBC 7.1  HGB 13.3  HCT 39.9  MCV 100.5*  PLT 681   Basic Metabolic Panel:  Recent Labs Lab 09/27/16 1119  NA 138  K 3.5  CL 99*  CO2 26  GLUCOSE 128*  BUN 18  CREATININE 0.76  CALCIUM 9.0    GFR: Estimated Creatinine Clearance: 91.5 mL/min (by C-G formula based on SCr of 0.76 mg/dL). Liver Function Tests: No results for input(s): AST, ALT, ALKPHOS, BILITOT, PROT, ALBUMIN in the last 168 hours. No results for input(s): LIPASE, AMYLASE in the last 168 hours. No results for input(s): AMMONIA in the last 168 hours. Coagulation Profile: No results for input(s): INR, PROTIME in the last 168 hours. Cardiac Enzymes:  Recent Labs Lab 09/27/16 1119  TROPONINI <0.03   BNP (last 3 results) No results for input(s): PROBNP in the last 8760 hours. HbA1C: No results for input(s): HGBA1C in the last 72 hours. CBG: No results for input(s): GLUCAP in the last 168 hours. Lipid Profile: No results for input(s): CHOL, HDL, LDLCALC, TRIG, CHOLHDL, LDLDIRECT in the last 72 hours. Thyroid Function Tests: No results for input(s): TSH, T4TOTAL, FREET4, T3FREE, THYROIDAB in the last 72 hours. Anemia Panel: No results for input(s): VITAMINB12, FOLATE, FERRITIN, TIBC, IRON, RETICCTPCT in the last 72 hours. Urine analysis: No results found for: COLORURINE, APPEARANCEUR, LABSPEC, PHURINE, GLUCOSEU, HGBUR, BILIRUBINUR, KETONESUR, PROTEINUR, UROBILINOGEN, NITRITE, LEUKOCYTESUR Sepsis Labs: @LABRCNTIP (procalcitonin:4,lacticidven:4) )No results found for this or any previous visit (from the past 240 hour(s)).   Radiological Exams on Admission: Dg Chest 2 View  Result Date: 09/27/2016 CLINICAL DATA:  Short of breath for 2 days EXAM: CHEST  2 VIEW COMPARISON:  None. FINDINGS: The lungs remain clear and somewhat hyperaerated. Mediastinal and hilar contours are unremarkable. The heart is within upper limits normal. There are degenerative changes within the thoracic spine. IMPRESSION: Hyperaeration.  No pneumonia or effusion. Electronically Signed   By: Ivar Drape M.D.   On: 09/27/2016 10:54   Ct Angio Chest Pe W And/or Wo Contrast  Result Date: 09/27/2016 CLINICAL DATA:  Dyspnea and positive  D-dimer. EXAM: CT ANGIOGRAPHY CHEST WITH CONTRAST TECHNIQUE: Multidetector CT imaging of the chest was performed using the standard protocol during bolus administration of intravenous contrast. Multiplanar CT image reconstructions and MIPs were obtained to evaluate the vascular anatomy. CONTRAST:  100 cc of Isovue 370 COMPARISON:  Plain film 09/27/2016 FINDINGS: Cardiovascular: The quality of this exam for evaluation of pulmonary embolism is moderate. Limitations include motion, suboptimal bolus timing, with contrast centered in the SVC. No pulmonary embolism to the large segmental level. Aortic and branch vessel atherosclerosis. Normal heart size, without pericardial effusion. Lad coronary artery atherosclerosis. Mediastinum/Nodes: No mediastinal or hilar adenopathy. Lungs/Pleura: No pleural fluid. Mild motion degradation throughout. Moderate centrilobular emphysema. Inferior right middle lobe volume loss and atelectasis. No obstructive cause identified. 5 mm right lower lobe pulmonary nodule on image 71. 5 mm left lower lobe pulmonary nodule on image 126. Minimal volume loss in the lingula. Upper Abdomen: Moderate to marked hepatic steatosis. Normal imaged portions of the spleen, stomach, pancreas, adrenal glands,  kidneys. Abdominal aortic atherosclerosis. Musculoskeletal: Moderate thoracic spondylosis. Review of the MIP images confirms the above findings. IMPRESSION: 1. No evidence of pulmonary embolism, with mild limitations as detailed above. 2. Centrilobular emphysema with bilateral pulmonary nodules. Non-contrast chest CT can be considered in 12 months, given risk factor(s). This recommendation follows the consensus statement: Guidelines for Management of Incidental Pulmonary Nodules Detected on CT Images:From the Fleischner Society 2017; published online before print (10.1148/radiol.[id number]). 3.  Coronary artery atherosclerosis. Aortic atherosclerosis. 4. Hepatic steatosis. Electronically Signed   By:  Abigail Miyamoto M.D.   On: 09/27/2016 15:33    EKG: Independently reviewed. Sinus tachycardiac with PVCs  Assessment/Plan Principal Problem:   Acute respiratory failure with hypoxia (HCC) -due to CHF and COPD exacerbation -iV lasix 40mg  q12, IV solumedrol,duonebs -check reps virus panel    COPD with acute exacerbation (HCC) -duonebs, solumedrol as above -FU resp viral panel   acute on chronic diastolic CHF -EF 32% on recent ECHO at [facility] 1/18 -IV lasix as above -monitor weights, I/Os -supplement K    HTN (hypertension), benign -stable, resume ARB   DVT prophylaxis: lovenox Code Status: Full Code  Family Communication: none at bedsode  Disposition Plan: Home when improved Consults called: none  Admission status: inpatient  Domenic Polite MD [facility] Pager [phone]  If 7PM-7AM, please contact night-coverage [email or web address] Password [facility]  09/27/2016, 5:45 PM

## 2016-09-27 NOTE — ED Provider Notes (Signed)
[place] DEPT Provider Note   CSN: [id number] Arrival date & time: 09/27/16  1004     History   Chief Complaint Chief Complaint  Patient presents with  . Shortness of Breath    HPI Gabriel Stafford is a 71 y.o. male with a h/o of chronic respiratory failure and COPD on 4L of home O2 who presents to the Emergency Department for severe dyspnea that began when he awoke this morning with associated bilateral lower leg swelling. He also reports associated abdominal distention that was worse today.    He reports he was recently discharged from [facility] after an admission for acute diastolic CHF and COPD. He was treated with lasix, steroids during the admission.    HPI  Past Medical History:  Diagnosis Date  . CHF (congestive heart failure) ([place])   . COPD (chronic obstructive pulmonary disease) [facility])     Patient Active Problem List   Diagnosis Date Noted  . COPD with acute exacerbation ([place]) 09/27/2016  . CHF (congestive heart failure) ([place]) 09/27/2016  . HTN (hypertension), benign 09/27/2016  . Acute respiratory failure with hypoxia ([place]) 09/27/2016    Past Surgical History:  Procedure Laterality Date  . CYST REMOVAL NECK         Home Medications    Prior to Admission medications   Medication Sig Start Date End Date Taking? Authorizing Provider  ARNUITY ELLIPTA 200 MCG/ACT AEPB Take 1 puff by mouth every morning. 09/14/16  Yes Historical Provider, MD  furosemide (LASIX) 20 MG tablet Take 20-40 mg by mouth 2 (two) times daily. Take 2 tablets in the morning, and 1 tablet in the evening 09/07/16  Yes Historical Provider, MD  ipratropium-albuterol (DUONEB) 0.5-2.5 (3) MG/3ML SOLN Take 3 mLs by nebulization every 4 (four) hours as needed for wheezing or shortness of breath. 09/07/16  Yes Historical Provider, MD  losartan (COZAAR) 50 MG tablet Take 50 mg by mouth every morning. 09/15/16  Yes Historical Provider, MD  STIOLTO RESPIMAT 2.5-2.5 MCG/ACT AERS Take 2 puffs by mouth  every morning. 09/14/16  Yes Historical Provider, MD    Family History No family history on file.  Social History Social History  Substance Use Topics  . Smoking status: Current Some Day Smoker    Types: Cigarettes  . Smokeless tobacco: Never Used  . Alcohol use Yes     Allergies   Patient has no known allergies.   Review of Systems Review of Systems  Constitutional: Negative for chills and fever.  HENT: Negative for congestion.   Eyes: Negative for visual disturbance.  Respiratory: Positive for shortness of breath.   Cardiovascular: Positive for chest pain and leg swelling.  Gastrointestinal: Positive for abdominal distention. Negative for abdominal pain, diarrhea, nausea and vomiting.  Genitourinary: Negative for dysuria.  Musculoskeletal: Negative for back pain.  Allergic/Immunologic: Negative for immunocompromised state.  Neurological: Negative for headaches.  Psychiatric/Behavioral: Negative for confusion.   Physical Exam Updated Vital Signs BP 126/74 (BP Location: Left Arm)   Pulse (!) 113   Temp 98.5 F (36.9 C) (Oral)   Resp 19   Ht 5\' 9"  (1.753 m)   Wt 91.2 kg   SpO2 100%   BMI 29.70 kg/m   Physical Exam  Constitutional: He appears well-developed and well-nourished. Nasal cannula in place.  HENT:  Head: Normocephalic and atraumatic.  Eyes: Conjunctivae are normal.  Neck: Neck supple.  Cardiovascular: Regular rhythm and intact distal pulses.  Tachycardia present.   No murmur heard. Pulmonary/Chest: Breath sounds normal. Tachypnea  noted. No respiratory distress. He has no wheezes. He has no rales.  Abdominal: Soft. He exhibits distension. There is no tenderness. There is no guarding.  Musculoskeletal: Normal range of motion. He exhibits edema. He exhibits no tenderness or deformity.  Bilateral extremity 2+  pitting edema. R>L, extending up to the thigh.   Neurological: He is alert.  Skin: Skin is warm and dry. No rash noted.  Psychiatric: His  behavior is normal.  Nursing note and vitals reviewed.  ED Treatments / Results  Labs (all labs ordered are listed, but only abnormal results are displayed) Labs Reviewed  D-DIMER, QUANTITATIVE (NOT AT [facility]) - Abnormal; Notable for the following:       Result Value   D-Dimer, Quant 2.53 (*)    All other components within normal limits  CBC - Abnormal; Notable for the following:    RBC 3.97 (*)    MCV 100.5 (*)    All other components within normal limits  BASIC METABOLIC PANEL - Abnormal; Notable for the following:    Chloride 99 (*)    Glucose, Bld 128 (*)    All other components within normal limits  CBC - Abnormal; Notable for the following:    WBC 11.7 (*)    RBC 4.09 (*)    MCV 102.0 (*)    MCH 34.2 (*)    All other components within normal limits  BASIC METABOLIC PANEL - Abnormal; Notable for the following:    Chloride 98 (*)    Glucose, Bld 194 (*)    BUN 25 (*)    All other components within normal limits  BASIC METABOLIC PANEL - Abnormal; Notable for the following:    Chloride 99 (*)    Glucose, Bld 197 (*)    BUN 33 (*)    All other components within normal limits  CBC - Abnormal; Notable for the following:    WBC 10.7 (*)    RBC 3.51 (*)    Hemoglobin 12.0 (*)    HCT 36.3 (*)    MCV 103.4 (*)    MCH 34.2 (*)    All other components within normal limits  RESPIRATORY PANEL BY PCR  BRAIN NATRIURETIC PEPTIDE  TROPONIN I  TROPONIN I  TROPONIN I  MAGNESIUM    EKG  EKG Interpretation  Date/Time:  Tuesday September 27 2016 10:05:00 EDT Ventricular Rate:  122 PR Interval:    QRS Duration: 78 QT Interval:  314 QTC Calculation: 448 R Axis:   79 Text Interpretation:  Sinus tachycardia Multiple ventricular premature complexes No old tracing to compare Confirmed by Winfred Leeds  MD, SAM [phone]) on 09/27/2016 10:15:25 AM Also confirmed by Winfred Leeds  MD, SAM [phone]), editor Nelly Laurence, [place] ([id number])  on 09/27/2016 10:54:50 AM       Radiology No results  found.  Procedures Procedures (including critical care time)  Medications Ordered in ED Medications  ipratropium-albuterol (DUONEB) 0.5-2.5 (3) MG/3ML nebulizer solution 3 mL (3 mLs Nebulization Given 09/28/16 2119)  losartan (COZAAR) tablet 50 mg (50 mg Oral Given 09/29/16 0538)  enoxaparin (LOVENOX) injection 40 mg (40 mg Subcutaneous Given 09/28/16 2119)  acetaminophen (TYLENOL) tablet 650 mg (not administered)    Or  acetaminophen (TYLENOL) suppository 650 mg (not administered)  ondansetron (ZOFRAN) tablet 4 mg (not administered)    Or  ondansetron (ZOFRAN) injection 4 mg (not administered)  potassium chloride SA (K-DUR,KLOR-CON) CR tablet 40 mEq (40 mEq Oral Given 09/29/16 0809)  pneumococcal 23 valent vaccine (PNU-IMMUNE) injection 0.5 mL (0.5  mLs Intramuscular Not Given 09/29/16 1000)  ipratropium-albuterol (DUONEB) 0.5-2.5 (3) MG/3ML nebulizer solution 3 mL (3 mLs Nebulization Given 09/29/16 1510)  furosemide (LASIX) injection 80 mg (80 mg Intravenous Given 09/29/16 0538)  predniSONE (DELTASONE) tablet 50 mg (50 mg Oral Given 6/44/03 4742)  folic acid (FOLVITE) tablet 1 mg (1 mg Oral Given 09/29/16 0809)  iopamidol (ISOVUE-370) 76 % injection (100 mLs  Contrast Given 09/27/16 1333)  ipratropium-albuterol (DUONEB) 0.5-2.5 (3) MG/3ML nebulizer solution 3 mL (3 mLs Nebulization Given 09/27/16 1715)  furosemide (LASIX) injection 20 mg (20 mg Intravenous Given 09/27/16 1725)  furosemide (LASIX) injection 20 mg (20 mg Intravenous Given 09/27/16 2210)     Initial Impression / Assessment and Plan / ED Course  I have reviewed the triage vital signs and the nursing notes.  Pertinent labs & imaging results that were available during my care of the patient were reviewed by me and considered in my medical decision making (see chart for details).     Shontez Sermon is a 71 year old male who presents with acute hypoxia and orthopnea. Reports home O2 use of 4L. Presented to the ED on 6L. Titrated the  patient back down to 4L with O2 sats maintained in the 90s. CXR negative for PTX and pneumonia. D-dimer elevated 2.53. Ordered CT Angio and received a call from CT tech that the patient was became hypoxic and sating in the 70s so the exam was not completed. Upon return to the ED, was able to lie the patient flat and maintain his O2 sats as long as he was slowly laid flat. The patient returned to CT and the exam was negative for PE. Received call from nursing staff that the patient stood up and moved around to go to the bathroom and became increasingly hypoxic. Duoneb administered with minimal improvement. 20 of Lasix administered for leg swelling. Discussed and evaluated the patient with Dr. Winfred Leeds, attending physician. Consulted the internal medicine team who will admit the patient.   Final Clinical Impressions(s) / ED Diagnoses   Final diagnoses:  COPD exacerbation [facility])    New Prescriptions Current Discharge Medication List       Joanne Gavel, PA-C 09/29/16 [place], MD 09/30/16 [id number]

## 2016-09-27 NOTE — ED Notes (Signed)
Spoke with CT about coming back for the patient  They stated they will come get him as soon as they can

## 2016-09-27 NOTE — ED Notes (Signed)
Pt transported to CT ?

## 2016-09-27 NOTE — ED Notes (Signed)
Pt st's he feels like he is more short of breath than before.  St's his abd feels tighter.  MD made aware

## 2016-09-27 NOTE — ED Notes (Signed)
Pt receiving breathing tx at this time 

## 2016-09-27 NOTE — ED Triage Notes (Signed)
Patient CO of some increasing weakness over the last few days with some worsening bilateral leg edema. Took his lasix like normal but states that he hasn't noticed and decrease in swelling. Woke up short of breath this AM with some ABD distention. He used his home breathing treatment with no relief. On Home O2 @ 3-4 L, increased it but no relief. recived 10 albuterol, 1 ipatropium, and 125 solumedrol with EMS. Bilateral expiratory wheezing noted

## 2016-09-27 NOTE — ED Notes (Signed)
Got patient undress into a gown on the monitor waiting on provider

## 2016-09-27 NOTE — ED Notes (Signed)
Admitting MD at bedside at this time.

## 2016-09-27 NOTE — ED Provider Notes (Signed)
Presents with shortness of breath onset yesterday, accompanied by increased swelling in both legs over the past 2 or 3 days. EMS treated patient with an albuterol nebulized treatment and 2 DuoNeb treatments as well as intravenous Solu-Medrol prior to coming here he reports some improvement of breathing presently. On exam patient is alert appears in mild-to-moderate respiratory distress. Speaks in sentences. HEENT exam no facial asymmetry neck supple positive JVD lungs prolonged expiratory phase with diminished breath sounds diffusely no real wheezes or rales abdomen nontender bilateral lower extremities with 2+ pretibial pitting edema Chest x-ray viewed by me   Orlie Dakin, MD 09/27/16 1121

## 2016-09-28 LAB — RESPIRATORY PANEL BY PCR
Adenovirus: NOT DETECTED
BORDETELLA PERTUSSIS-RVPCR: NOT DETECTED
CORONAVIRUS 229E-RVPPCR: NOT DETECTED
CORONAVIRUS OC43-RVPPCR: NOT DETECTED
Chlamydophila pneumoniae: NOT DETECTED
Coronavirus HKU1: NOT DETECTED
Coronavirus NL63: NOT DETECTED
INFLUENZA B-RVPPCR: NOT DETECTED
Influenza A: NOT DETECTED
Metapneumovirus: NOT DETECTED
Mycoplasma pneumoniae: NOT DETECTED
PARAINFLUENZA VIRUS 1-RVPPCR: NOT DETECTED
Parainfluenza Virus 2: NOT DETECTED
Parainfluenza Virus 3: NOT DETECTED
Parainfluenza Virus 4: NOT DETECTED
RESPIRATORY SYNCYTIAL VIRUS-RVPPCR: NOT DETECTED
Rhinovirus / Enterovirus: NOT DETECTED

## 2016-09-28 LAB — BASIC METABOLIC PANEL
Anion gap: 10 (ref 5–15)
BUN: 25 mg/dL — ABNORMAL HIGH (ref 6–20)
CALCIUM: 9.3 mg/dL (ref 8.9–10.3)
CO2: 29 mmol/L (ref 22–32)
CREATININE: 1.14 mg/dL (ref 0.61–1.24)
Chloride: 98 mmol/L — ABNORMAL LOW (ref 101–111)
Glucose, Bld: 194 mg/dL — ABNORMAL HIGH (ref 65–99)
Potassium: 4 mmol/L (ref 3.5–5.1)
Sodium: 137 mmol/L (ref 135–145)

## 2016-09-28 LAB — CBC
HEMATOCRIT: 41.7 % (ref 39.0–52.0)
Hemoglobin: 14 g/dL (ref 13.0–17.0)
MCH: 34.2 pg — ABNORMAL HIGH (ref 26.0–34.0)
MCHC: 33.6 g/dL (ref 30.0–36.0)
MCV: 102 fL — ABNORMAL HIGH (ref 78.0–100.0)
Platelets: 260 10*3/uL (ref 150–400)
RBC: 4.09 MIL/uL — ABNORMAL LOW (ref 4.22–5.81)
RDW: 13 % (ref 11.5–15.5)
WBC: 11.7 10*3/uL — ABNORMAL HIGH (ref 4.0–10.5)

## 2016-09-28 LAB — TROPONIN I

## 2016-09-28 MED ORDER — IPRATROPIUM-ALBUTEROL 0.5-2.5 (3) MG/3ML IN SOLN
3.0000 mL | Freq: Four times a day (QID) | RESPIRATORY_TRACT | Status: DC
  Administered 2016-09-29 – 2016-09-30 (×6): 3 mL via RESPIRATORY_TRACT
  Filled 2016-09-28 (×7): qty 3

## 2016-09-28 MED ORDER — FUROSEMIDE 10 MG/ML IJ SOLN
80.0000 mg | Freq: Two times a day (BID) | INTRAMUSCULAR | Status: DC
  Administered 2016-09-28 – 2016-09-30 (×4): 80 mg via INTRAVENOUS
  Filled 2016-09-28 (×4): qty 8

## 2016-09-28 MED ORDER — PREDNISONE 50 MG PO TABS
50.0000 mg | ORAL_TABLET | Freq: Every day | ORAL | Status: DC
  Administered 2016-09-29 – 2016-09-30 (×2): 50 mg via ORAL
  Filled 2016-09-28 (×2): qty 1

## 2016-09-28 MED ORDER — PNEUMOCOCCAL VAC POLYVALENT 25 MCG/0.5ML IJ INJ
0.5000 mL | INJECTION | INTRAMUSCULAR | Status: DC
  Filled 2016-09-28: qty 0.5

## 2016-09-28 NOTE — Consult Note (Signed)
   [facility] CM Inpatient Consult   09/28/2016  Ezzard Ditmer July 01, 1945 [id number]  Patient screened for post hospital follow up needs.  Noted patient recently discharged from [facility] Chart review reveals the patient is Pruitt Taboada is a 71 y.o. male with medical history significant of COPD/chronic resp failure on 4L Home O2, ongoing tobacco use, Chronic diastolic CHF, HTN was recently discharged from [facility] after admission for COPD  and diastolic CHF, he was treated with steroids, lasix and discharged home.  Patient re-admitted with exacerbation.  Met with the patient at bedside.  Patient states his primary care provider is at [facility].  He will receive post hospital follow up calls at that practice.  Patient expressed no community care management needs. Patient was given a brochure with contact information.  For questions, please contact:  Natividad Brood, RN BSN [facility] Liaison  [phone] business mobile phone Toll free office [phone]

## 2016-09-28 NOTE — Progress Notes (Signed)
Patient continues to be dyspneic during this shift, very short of breath with any exertion, short of breath at rest at times.  Lasix increased, patient did put out a large volume of urine after 4 p.m. Dose, states he feels better already.  Remains on 4 liters of oxygen as prior to admission.

## 2016-09-28 NOTE — Plan of Care (Signed)
Problem: Activity: Goal: Capacity to carry out activities will improve Outcome: Not Progressing Patient still very dyspneic with any exertion (ie. Walking from bed to chair) and even relates shortness of breath at rest

## 2016-09-28 NOTE — Progress Notes (Signed)
Respiratory panel back as negative, droplet precautions discontinued.

## 2016-09-28 NOTE — Progress Notes (Signed)
Patient's daughter dropped off HCPOA forms. Copies made and placed in the chart. Original copies given back to daughter.

## 2016-09-28 NOTE — Progress Notes (Signed)
[facility] Progress Note  Patient: Gabriel Stafford VFI:[id number]   PCP: Pcp Not In System DOB: [birth year]   DOA: 09/27/2016   DOS: 09/28/2016   Date of Service: the patient was seen and examined on 09/28/2016  Subjective: Continues to have shortness of breath, no chest pain. Nausea and vomiting but no fever.  Brief hospital course: Pt. with PMH of chronic respiratory failure on 4 L of oxygen at home, COPD, active tobacco use, chronic diastolic CHF, hypertension; admitted on 09/27/2016, with complaint of shortness of breath, was found to have acute on chronic diastolic dysfunction. Currently further plan is continue IV diuresis.  Assessment and Plan: 1. Acute respiratory failure with hypoxia (HCC) Acute on chronic diastolic dysfunction. Continue IV Lasix, will increased from 40 mg twice a day to 80 mg twice a day. Continue losartan. Recent echocardiogram in January 2018 shows EF of 50-60%. Diastolic dysfunction reported. Patient mentions she was compliant with all medications.  2. Suspected COPD exacerbation. Negative respiratory panel. I would reduce the steroids daily. Continue nebulizer.  3. Essential hypertension. Recently has multiple changes in his medication since last admission. Continue losartan present. Continue Lasix.  4. Questionable history of alcohol abuse. 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 Similar history to obtain in recent discharge summary from [place], although the discharge summary mentions there was no requirement for benzodiazepine during the stay in the hospital. Continue to closely monitor.  Bowel regimen: last BM 09/27/2016 Diet: Cardiac diet DVT Prophylaxis: subcutaneous Heparin  Advance goals of care discussion: Full code  Family Communication: family was present at bedside, at the time of interview. The pt provided permission to discuss medical plan with the family. Opportunity was given to ask question and all questions were  answered satisfactorily.   Disposition:  Discharge to home. Expected discharge date: 09/30/2016,   Consultants: none Procedures: none  Antibiotics: Anti-infectives    None       Objective: Physical Exam: Vitals:   09/28/16 0310 09/28/16 0829 09/28/16 1024 09/28/16 1316  BP: 139/78  (!) 139/39   Pulse: (!) 108  (!) 115   Resp: 20     Temp: 98.4 F (36.9 C)     TempSrc: Oral     SpO2: 97% 100% 99% 100%  Weight:      Height:        Intake/Output Summary (Last 24 hours) at 09/28/16 1737 Last data filed at 09/28/16 1651  Gross per 24 hour  Intake              240 ml  Output             1450 ml  Net            -1210 ml   Filed Weights   09/27/16 1006 09/27/16 1908 09/28/16 0307  Weight: 90.7 kg (200 lb) 88.5 kg (195 lb 3.2 oz) 91.2 kg (201 lb 1.6 oz)   General: Alert, Awake and Oriented to Time, Place and Person. Appear in mild distress, affect appropriate Eyes: PERRL, Conjunctiva normal ENT: Oral Mucosa clear moist. Neck: difficult to assess JVD, no Abnormal Mass Or lumps Cardiovascular: S1 and S2 Present, no Murmur, Respiratory: Bilateral Air entry equal and Decreased, no use of accessory muscle, bilateral Crackles, no wheezes Abdomen: Bowel Sound present, Soft and no tenderness Skin: no redness, no Rash, no induration Extremities: bilateral Pedal edema, no calf tenderness Neurologic: Grossly no focal neuro deficit. Bilaterally Equal motor strength  Data Reviewed: CBC:  Recent Labs Lab  09/27/16 1119 09/28/16 0622  WBC 7.1 11.7*  HGB 13.3 14.0  HCT 39.9 41.7  MCV 100.5* 102.0*  PLT 212 696   Basic Metabolic Panel:  Recent Labs Lab 09/27/16 1119 09/28/16 0622  NA 138 137  K 3.5 4.0  CL 99* 98*  CO2 26 29  GLUCOSE 128* 194*  BUN 18 25*  CREATININE 0.76 1.14  CALCIUM 9.0 9.3    Liver Function Tests: No results for input(s): AST, ALT, ALKPHOS, BILITOT, PROT, ALBUMIN in the last 168 hours. No results for input(s): LIPASE, AMYLASE in the last  168 hours. No results for input(s): AMMONIA in the last 168 hours. Coagulation Profile: No results for input(s): INR, PROTIME in the last 168 hours. Cardiac Enzymes:  Recent Labs Lab 09/27/16 1119 09/27/16 2349 09/28/16 0622  TROPONINI <0.03 <0.03 <0.03   BNP (last 3 results) No results for input(s): PROBNP in the last 8760 hours. CBG: No results for input(s): GLUCAP in the last 168 hours. Studies: No results found.  Scheduled Meds: . enoxaparin (LOVENOX) injection  40 mg Subcutaneous Q24H  . furosemide  80 mg Intravenous Q12H  . ipratropium-albuterol  3 mL Nebulization QID  . losartan  50 mg Oral BH-q7a  . methylPREDNISolone (SOLU-MEDROL) injection  60 mg Intravenous Q12H  . [START ON 09/29/2016] pneumococcal 23 valent vaccine  0.5 mL Intramuscular Tomorrow-1000  . potassium chloride  40 mEq Oral BID   Continuous Infusions: PRN Meds: acetaminophen **OR** acetaminophen, ipratropium-albuterol, ondansetron **OR** ondansetron (ZOFRAN) IV  Time spent: 30 minutes  Author: Berle Mull, MD [facility] Pager: [phone] 09/28/2016 5:37 PM  If 7PM-7AM, please contact night-coverage at [email or web address], password [facility]

## 2016-09-28 NOTE — Progress Notes (Signed)
Long discussion with daughter regarding patient.  Daughter is very concerned that patient is not being honest with staff, states patient drinks daily, is not able to eat and is short of breath constantly.  She feels like patient is not telling staff everything because he doesn't want to appear as sick as he really is.  Daughter is concerned that patient may be at end of life, wants to know about patients prognosis.  Daughter states she has what sounds like living will or healthcare power of attorney papers at home, RN encouraged her to bring whatever she had at home in for [place] to see and make a copy of.

## 2016-09-28 NOTE — Progress Notes (Signed)
Patient rested well overnight. No complaints of pain or respiratory distress reported. VSS. No complaints at this time.

## 2016-09-29 LAB — BASIC METABOLIC PANEL
Anion gap: 10 (ref 5–15)
BUN: 33 mg/dL — AB (ref 6–20)
CALCIUM: 9.4 mg/dL (ref 8.9–10.3)
CO2: 28 mmol/L (ref 22–32)
Chloride: 99 mmol/L — ABNORMAL LOW (ref 101–111)
Creatinine, Ser: 1.13 mg/dL (ref 0.61–1.24)
GFR calc Af Amer: >60 mL/min (ref >60)
GLUCOSE: 197 mg/dL — AB (ref 65–99)
Potassium: 4.3 mmol/L (ref 3.5–5.1)
Sodium: 137 mmol/L (ref 135–145)

## 2016-09-29 LAB — CBC
HCT: 36.3 % — ABNORMAL LOW (ref 39.0–52.0)
Hemoglobin: 12 g/dL — ABNORMAL LOW (ref 13.0–17.0)
MCH: 34.2 pg — ABNORMAL HIGH (ref 26.0–34.0)
MCHC: 33.1 g/dL (ref 30.0–36.0)
MCV: 103.4 fL — ABNORMAL HIGH (ref 78.0–100.0)
Platelets: 223 10*3/uL (ref 150–400)
RBC: 3.51 MIL/uL — ABNORMAL LOW (ref 4.22–5.81)
RDW: 13.1 % (ref 11.5–15.5)
WBC: 10.7 10*3/uL — ABNORMAL HIGH (ref 4.0–10.5)

## 2016-09-29 LAB — MAGNESIUM: Magnesium: 2 mg/dL (ref 1.7–2.4)

## 2016-09-29 MED ORDER — FOLIC ACID 1 MG PO TABS
1.0000 mg | ORAL_TABLET | Freq: Every day | ORAL | Status: DC
  Administered 2016-09-29 – 2016-09-30 (×2): 1 mg via ORAL
  Filled 2016-09-29 (×2): qty 1

## 2016-09-29 NOTE — Progress Notes (Signed)
MD, pt was asking for Ted hoses to go home with, thanks, Arvella Nigh RN

## 2016-09-29 NOTE — Progress Notes (Signed)
[facility] Progress Note  Patient: Gabriel Stafford [id number]:[id number]   PCP: Pcp Not In System DOB: [birth year]   DOA: 09/27/2016   DOS: 09/29/2016   Date of Service: the patient was seen and examined on 09/29/2016  Subjective: Feeling significantly better, leg swelling has improved. Shortness of breath is also improved on exertion.  Brief hospital course: Pt. with PMH of chronic respiratory failure on 4 L of oxygen at home, COPD, active tobacco use, chronic diastolic CHF, hypertension; admitted on 09/27/2016, with complaint of shortness of breath, was found to have acute on chronic diastolic dysfunction. Currently further plan is continue IV diuresis.  Assessment and Plan: 1. Acute on chronic respiratory failure with hypoxia. Acute on chronic diastolic dysfunction. Continue IV Lasix, 80 mg twice a day. Continue losartan. Recent echocardiogram in January 2018 shows EF of 50-60%. Diastolic dysfunction reported. Patient mentions she was compliant with all medications.  2. Suspected COPD exacerbation. Negative respiratory panel. I would reduce the steroids daily. Continue nebulizer.  3. Essential hypertension. Recently has multiple changes in his medication since last admission. Continue losartan present. Continue Lasix.  4. Questionable history of alcohol abuse. 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 Similar history to obtain in recent discharge summary from [place], although the discharge summary mentions there was no requirement for benzodiazepine during the stay in the hospital. Continue to closely monitor.  Bowel regimen: last BM 09/27/2016 Diet: Cardiac diet DVT Prophylaxis: subcutaneous Heparin  Advance goals of care discussion: Full code  Family Communication: family was present at bedside, at the time of interview. The pt provided permission to discuss medical plan with the family. Opportunity was given to ask question and all questions were answered  satisfactorily.   Disposition:  Discharge to home. Expected discharge date: 09/30/2016,   Consultants: none Procedures: none  Antibiotics: Anti-infectives    None       Objective: Physical Exam: Vitals:   09/28/16 1316 09/28/16 2043 09/29/16 0623 09/29/16 1510  BP:  132/83 126/74   Pulse:  (!) 107 (!) 113   Resp:  20 19   Temp:  98.5 F (36.9 C) 98.5 F (36.9 C)   TempSrc:  Oral Oral   SpO2: 100% 100% 99% 100%  Weight:      Height:        Intake/Output Summary (Last 24 hours) at 09/29/16 1750 Last data filed at 09/29/16 1635  Gross per 24 hour  Intake              840 ml  Output             1600 ml  Net             -760 ml   Filed Weights   09/27/16 1006 09/27/16 1908 09/28/16 0307  Weight: 90.7 kg (200 lb) 88.5 kg (195 lb 3.2 oz) 91.2 kg (201 lb 1.6 oz)   General: Alert, Awake and Oriented to Time, Place and Person. Appear in mild distress, affect appropriate Eyes: PERRL, Conjunctiva normal ENT: Oral Mucosa clear moist. Neck: difficult to assess JVD, no Abnormal Mass Or lumps Cardiovascular: S1 and S2 Present, no Murmur, Respiratory: Bilateral Air entry equal and Decreased, no use of accessory muscle, bilateral Crackles, no wheezes Abdomen: Bowel Sound present, Soft and no tenderness Skin: no redness, no Rash, no induration Extremities: bilateral Pedal edema, no calf tenderness Neurologic: Grossly no focal neuro deficit. Bilaterally Equal motor strength  Data Reviewed: CBC:  Recent Labs Lab 09/27/16 1119 09/28/16 0622 09/29/16  0415  WBC 7.1 11.7* 10.7*  HGB 13.3 14.0 12.0*  HCT 39.9 41.7 36.3*  MCV 100.5* 102.0* 103.4*  PLT 212 260 067   Basic Metabolic Panel:  Recent Labs Lab 09/27/16 1119 09/28/16 0622 09/29/16 0415  NA 138 137 137  K 3.5 4.0 4.3  CL 99* 98* 99*  CO2 26 29 28   GLUCOSE 128* 194* 197*  BUN 18 25* 33*  CREATININE 0.76 1.14 1.13  CALCIUM 9.0 9.3 9.4  MG  --   --  2.0    Liver Function Tests: No results for input(s):  AST, ALT, ALKPHOS, BILITOT, PROT, ALBUMIN in the last 168 hours. No results for input(s): LIPASE, AMYLASE in the last 168 hours. No results for input(s): AMMONIA in the last 168 hours. Coagulation Profile: No results for input(s): INR, PROTIME in the last 168 hours. Cardiac Enzymes:  Recent Labs Lab 09/27/16 1119 09/27/16 2349 09/28/16 0622  TROPONINI <0.03 <0.03 <0.03   BNP (last 3 results) No results for input(s): PROBNP in the last 8760 hours. CBG: No results for input(s): GLUCAP in the last 168 hours. Studies: No results found.  Scheduled Meds: . enoxaparin (LOVENOX) injection  40 mg Subcutaneous Q24H  . folic acid  1 mg Oral Daily  . furosemide  80 mg Intravenous Q12H  . ipratropium-albuterol  3 mL Nebulization QID  . losartan  50 mg Oral BH-q7a  . pneumococcal 23 valent vaccine  0.5 mL Intramuscular Tomorrow-1000  . potassium chloride  40 mEq Oral BID  . predniSONE  50 mg Oral Q breakfast   Continuous Infusions: PRN Meds: acetaminophen **OR** acetaminophen, ipratropium-albuterol, ondansetron **OR** ondansetron (ZOFRAN) IV  Time spent: 30 minutes  Author: Berle Mull, MD [facility] Pager: [phone] 09/29/2016 5:50 PM  If 7PM-7AM, please contact night-coverage at [email or web address], password [facility]

## 2016-09-30 LAB — BASIC METABOLIC PANEL
Anion gap: 8 (ref 5–15)
BUN: 36 mg/dL — ABNORMAL HIGH (ref 6–20)
CALCIUM: 9 mg/dL (ref 8.9–10.3)
CO2: 31 mmol/L (ref 22–32)
Chloride: 100 mmol/L — ABNORMAL LOW (ref 101–111)
Creatinine, Ser: 1.11 mg/dL (ref 0.61–1.24)
GFR calc non Af Amer: >60 mL/min (ref >60)
GLUCOSE: 87 mg/dL (ref 65–99)
Potassium: 4.2 mmol/L (ref 3.5–5.1)
Sodium: 139 mmol/L (ref 135–145)

## 2016-09-30 LAB — CBC
HCT: 36.7 % — ABNORMAL LOW (ref 39.0–52.0)
HEMOGLOBIN: 12.2 g/dL — AB (ref 13.0–17.0)
MCH: 34.4 pg — ABNORMAL HIGH (ref 26.0–34.0)
MCHC: 33.2 g/dL (ref 30.0–36.0)
MCV: 103.4 fL — ABNORMAL HIGH (ref 78.0–100.0)
Platelets: 194 10*3/uL (ref 150–400)
RBC: 3.55 MIL/uL — AB (ref 4.22–5.81)
RDW: 13.1 % (ref 11.5–15.5)
WBC: 10.5 10*3/uL (ref 4.0–10.5)

## 2016-09-30 LAB — VITAMIN B12: VITAMIN B 12: 357 pg/mL (ref 180–914)

## 2016-09-30 MED ORDER — POTASSIUM CHLORIDE CRYS ER 20 MEQ PO TBCR: 40.0000 meq | EXTENDED_RELEASE_TABLET | Freq: Every day | ORAL | 0 refills | Status: DC

## 2016-09-30 MED ORDER — PREDNISONE 10 MG PO TABS: ORAL_TABLET | ORAL | 0 refills | Status: DC

## 2016-09-30 MED ORDER — FOLIC ACID 1 MG PO TABS: 1.0000 mg | ORAL_TABLET | Freq: Every day | ORAL | 0 refills | Status: AC

## 2016-09-30 MED ORDER — TORSEMIDE 20 MG PO TABS: 40.0000 mg | ORAL_TABLET | Freq: Two times a day (BID) | ORAL | 0 refills | Status: DC

## 2016-09-30 NOTE — Progress Notes (Signed)
Patient given discharge instructions and all questions answered.  Patient discharged via wheelchair with all belongings.   

## 2016-09-30 NOTE — Care Management Note (Signed)
Case Management Note  Patient Details  Name: [name] MRN: [id number] Date of Birth: 10-20-1945  Subjective/Objective:    Admitted with Acute Resp Failure               Action/Plan: Patient lives at home; PCP is Dr [facility]; has private insurance with Medicare/ BCBS with prescription drug coverage; pharmacy of choice is [facility]; patient reports no problem getting his medication; he was going to Outpatient physical therapy prior to admission but feels as though that was too much and would like [facility] services; [place] choice offered, pt chose [facility]; Butch Penny with [facility] called for arrangements;  Expected Discharge Date:  09/30/16               Expected Discharge Plan:  [place]  In-House Referral:   [facility]  Discharge planning Services  CM Consult  Choice offered to:  Patient, Adult Children  HH Arranged:  RN [facility] Agency:  [place]  Status of Service:  In process, will continue to follow  Sherrilyn Rist [phone] 09/30/2016, 11:20 AM

## 2016-09-30 NOTE — Care Management Important Message (Signed)
Important Message  Patient Details  Name: Gabriel Stafford MRN: [id number] Date of Birth: 02/18/1946   Medicare Important Message Given:  Yes    [name] May 09/30/2016, 2:09 PM

## 2016-10-03 NOTE — Discharge Summary (Signed)
[facility] Discharge Summary   Patient: Gabriel Stafford ZOX:[id number]   PCP: Pcp Not In System DOB: 1945/09/23   Date of admission: 09/27/2016   Date of discharge: 09/30/2016    Discharge Diagnoses:  Principal Problem:   Acute respiratory failure with hypoxia ([place]) Active Problems:   COPD with acute exacerbation (HCC)   CHF (congestive heart failure) (HCC)   HTN (hypertension), benign   Admitted From: home Disposition:  home  Recommendations for Outpatient Follow-up:  1. Follow-up with PCP in one week. 2. Follow-up with cardiology in one month. 3. Patient has a scheduled follow-up with pulmonary on 511.   Follow-up Information    PCP On 10/06/2016.   Why:  AT 1:15pm for hospital follow up        cardiology. Go in 1 month.        pulmonary. Go on 10/14/2016.          Diet recommendation: Cardiac diet  Activity: The patient is advised to gradually reintroduce usual activities.  Discharge Condition: good  Code Status: Full code  History of present illness: As per the H and P dictated on admission, "Gabriel Stafford is a 71 y.o. male with medical history significant of COPD/chronic resp failure on 4L Home O2, ongoing tobacco use, Chronic diastolic CHF, HTN was recently discharged from [facility] after admission for COPD  and diastolic CHF, he was treated with steroids, lasix and discharged home. He then saw his PCP in the last 2weeks his BP med was changed to Cardizem 180mg  he was concerned after reading the side effects and went back to see his doctor who then changed the Cardizem to Losartan. He noted acute onset shortness of breath today associated with leg swelling, some Orthopnea, abd wall tightness and wheezing. He denies any fevers or chills"  Hospital Course:  Summary of his active problems in the hospital is as following. 1. Acute on chronic respiratory failure with hypoxia. Acute on chronic diastolic dysfunction. Treated with IV Lasix, 80 mg twice a day. I will  discharged on oral torsemide 40 mg twice a day Continue losartan. Recent echocardiogram in January 2018 shows EF of 50-60%. Diastolic dysfunction reported. Patient mentions she was compliant with all medications.  2. Suspected COPD exacerbation. Negative respiratory panel. I would reduce the steroids. Continue nebulizer.  3. Essential hypertension. Recently has multiple changes in his medication since last admission. Continue losartan present. Continue Lasix.  4. Questionable history of alcohol abuse. 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 Similar history to obtain in recent discharge summary from [facility], although the discharge summary mentions there was no requirement for benzodiazepine during the stay in the hospital.  All other chronic medical condition were stable during the hospitalization.  Patient was ambulatory without any assistance. On the day of the discharge the patient's vitals were stable, and no other acute medical condition were reported by patient. the patient was felt safe to be discharge at home with home health.  Procedures and Results:  Echocardiogram   Consultations:  none  DISCHARGE MEDICATION: Discharge Medication List as of 09/30/2016 12:09 PM    START taking these medications   Details  folic acid (FOLVITE) 1 MG tablet Take 1 tablet (1 mg total) by mouth daily., Starting Sat 10/01/2016, Normal    potassium chloride SA (K-DUR,KLOR-CON) 20 MEQ tablet Take 2 tablets (40 mEq total) by mouth daily., Starting Fri 09/30/2016, Normal    predniSONE (DELTASONE) 10 MG tablet Take 40mg  daily for 3days,Take 30mg  daily for 3days,Take  20mg  daily for 3days,Take 10mg  daily for 3days, then stop, Normal    torsemide (DEMADEX) 20 MG tablet Take 2 tablets (40 mg total) by mouth 2 (two) times daily. For 5 days then take 40 mg daily., Starting Fri 09/30/2016, Normal      CONTINUE these medications which have NOT CHANGED   Details  ARNUITY ELLIPTA 200  MCG/ACT AEPB Take 1 puff by mouth every morning., Starting Wed 09/14/2016, Historical Med    ipratropium-albuterol (DUONEB) 0.5-2.5 (3) MG/3ML SOLN Take 3 mLs by nebulization every 4 (four) hours as needed for wheezing or shortness of breath., Starting Wed 09/07/2016, Historical Med    losartan (COZAAR) 50 MG tablet Take 50 mg by mouth every morning., Starting Thu 09/15/2016, Historical Med    STIOLTO RESPIMAT 2.5-2.5 MCG/ACT AERS Take 2 puffs by mouth every morning., Starting Wed 09/14/2016, Historical Med      STOP taking these medications     furosemide (LASIX) 20 MG tablet        No Known Allergies Discharge Instructions    Compression stockings    Complete by:  As directed    Diet - low sodium heart healthy    Complete by:  As directed    Discharge instructions    Complete by:  As directed    It is important that you read following instructions as well as go over your medication list with RN to help you understand your care after this hospitalization.  Discharge Instructions: Please follow-up with PCP in one week  Please request your primary care physician to go over all Hospital Tests and Procedure/Radiological results at the follow up,  Please get all Hospital records sent to your PCP by signing hospital release before you go home.   Do not take more than prescribed Pain, Sleep and Anxiety Medications. You were cared for by a hospitalist during your hospital stay. If you have any questions about your discharge medications or the care you received while you were in the hospital after you are discharged, you can call the unit and ask to speak with the hospitalist on call if the hospitalist that took care of you is not available.  Once you are discharged, your primary care physician will handle any further medical issues. Please note that NO REFILLS for any discharge medications will be authorized once you are discharged, as it is imperative that you return to your primary care  physician (or establish a relationship with a primary care physician if you do not have one) for your aftercare needs so that they can reassess your need for medications and monitor your lab values. You Must read complete instructions/literature along with all the possible adverse reactions/side effects for all the Medicines you take and that have been prescribed to you. Take any new Medicines after you have completely understood and accept all the possible adverse reactions/side effects. Wear Seat belts while driving. If you have smoked or chewed Tobacco in the last 2 yrs please stop smoking and/or stop any Recreational drug use.   Increase activity slowly    Complete by:  As directed      Discharge Exam: Filed Weights   09/27/16 1908 09/28/16 0307 09/30/16 0528  Weight: 88.5 kg (195 lb 3.2 oz) 91.2 kg (201 lb 1.6 oz) 88.3 kg (194 lb 9.6 oz)   Vitals:   09/30/16 0528 09/30/16 1121  BP: 127/87 119/72  Pulse: (!) 103 (!) 112  Resp: 20 18  Temp: 97.8 F (36.6 C) 98.2 F (36.8  C)   General: Appear in no distress, no Rash; Oral Mucosa moist. Cardiovascular: S1 and S2 Present, no Murmur, no JVD Respiratory: Bilateral Air entry present and no Crackles, Occasional wheezes Abdomen: Bowel Sound present, Soft and no tenderness Extremities: no Pedal edema, no calf tenderness Neurology: Grossly no focal neuro deficit.  The results of significant diagnostics from this hospitalization (including imaging, microbiology, ancillary and laboratory) are listed below for reference.    Significant Diagnostic Studies: Dg Chest 2 View  Result Date: 09/27/2016 CLINICAL DATA:  Short of breath for 2 days EXAM: CHEST  2 VIEW COMPARISON:  None. FINDINGS: The lungs remain clear and somewhat hyperaerated. Mediastinal and hilar contours are unremarkable. The heart is within upper limits normal. There are degenerative changes within the thoracic spine. IMPRESSION: Hyperaeration.  No pneumonia or effusion.  Electronically Signed   By: Ivar Drape M.D.   On: 09/27/2016 10:54   Ct Angio Chest Pe W And/or Wo Contrast  Result Date: 09/27/2016 CLINICAL DATA:  Dyspnea and positive D-dimer. EXAM: CT ANGIOGRAPHY CHEST WITH CONTRAST TECHNIQUE: Multidetector CT imaging of the chest was performed using the standard protocol during bolus administration of intravenous contrast. Multiplanar CT image reconstructions and MIPs were obtained to evaluate the vascular anatomy. CONTRAST:  100 cc of Isovue 370 COMPARISON:  Plain film 09/27/2016 FINDINGS: Cardiovascular: The quality of this exam for evaluation of pulmonary embolism is moderate. Limitations include motion, suboptimal bolus timing, with contrast centered in the SVC. No pulmonary embolism to the large segmental level. Aortic and branch vessel atherosclerosis. Normal heart size, without pericardial effusion. Lad coronary artery atherosclerosis. Mediastinum/Nodes: No mediastinal or hilar adenopathy. Lungs/Pleura: No pleural fluid. Mild motion degradation throughout. Moderate centrilobular emphysema. Inferior right middle lobe volume loss and atelectasis. No obstructive cause identified. 5 mm right lower lobe pulmonary nodule on image 71. 5 mm left lower lobe pulmonary nodule on image 126. Minimal volume loss in the lingula. Upper Abdomen: Moderate to marked hepatic steatosis. Normal imaged portions of the spleen, stomach, pancreas, adrenal glands, kidneys. Abdominal aortic atherosclerosis. Musculoskeletal: Moderate thoracic spondylosis. Review of the MIP images confirms the above findings. IMPRESSION: 1. No evidence of pulmonary embolism, with mild limitations as detailed above. 2. Centrilobular emphysema with bilateral pulmonary nodules. Non-contrast chest CT can be considered in 12 months, given risk factor(s). This recommendation follows the consensus statement: Guidelines for Management of Incidental Pulmonary Nodules Detected on CT Images:From the Fleischner Society  2017; published online before print (10.1148/radiol.[id number]). 3.  Coronary artery atherosclerosis. Aortic atherosclerosis. 4. Hepatic steatosis. Electronically Signed   By: Abigail Miyamoto M.D.   On: 09/27/2016 15:33    Microbiology: Recent Results (from the past 240 hour(s))  Respiratory Panel by PCR     Status: None   Collection Time: 09/28/16  6:11 AM  Result Value Ref Range Status   Adenovirus NOT DETECTED NOT DETECTED Final   Coronavirus 229E NOT DETECTED NOT DETECTED Final   Coronavirus HKU1 NOT DETECTED NOT DETECTED Final   Coronavirus NL63 NOT DETECTED NOT DETECTED Final   Coronavirus OC43 NOT DETECTED NOT DETECTED Final   Metapneumovirus NOT DETECTED NOT DETECTED Final   Rhinovirus / Enterovirus NOT DETECTED NOT DETECTED Final   Influenza A NOT DETECTED NOT DETECTED Final   Influenza B NOT DETECTED NOT DETECTED Final   Parainfluenza Virus 1 NOT DETECTED NOT DETECTED Final   Parainfluenza Virus 2 NOT DETECTED NOT DETECTED Final   Parainfluenza Virus 3 NOT DETECTED NOT DETECTED Final   Parainfluenza Virus 4 NOT DETECTED  NOT DETECTED Final   Respiratory Syncytial Virus NOT DETECTED NOT DETECTED Final   Bordetella pertussis NOT DETECTED NOT DETECTED Final   Chlamydophila pneumoniae NOT DETECTED NOT DETECTED Final   Mycoplasma pneumoniae NOT DETECTED NOT DETECTED Final     Labs: CBC:  Recent Labs Lab 09/27/16 1119 09/28/16 0622 09/29/16 0415 09/30/16 0422  WBC 7.1 11.7* 10.7* 10.5  HGB 13.3 14.0 12.0* 12.2*  HCT 39.9 41.7 36.3* 36.7*  MCV 100.5* 102.0* 103.4* 103.4*  PLT 212 260 223 267   Basic Metabolic Panel:  Recent Labs Lab 09/27/16 1119 09/28/16 0622 09/29/16 0415 09/30/16 0422  NA 138 137 137 139  K 3.5 4.0 4.3 4.2  CL 99* 98* 99* 100*  CO2 26 29 28 31   GLUCOSE 128* 194* 197* 87  BUN 18 25* 33* 36*  CREATININE 0.76 1.14 1.13 1.11  CALCIUM 9.0 9.3 9.4 9.0  MG  --   --  2.0  --    Liver Function Tests: No results for input(s): AST, ALT, ALKPHOS,  BILITOT, PROT, ALBUMIN in the last 168 hours. No results for input(s): LIPASE, AMYLASE in the last 168 hours. No results for input(s): AMMONIA in the last 168 hours. Cardiac Enzymes:  Recent Labs Lab 09/27/16 1119 09/27/16 2349 09/28/16 0622  TROPONINI <0.03 <0.03 <0.03   BNP (last 3 results)  Recent Labs  09/27/16 1119  BNP 77.5   CBG: No results for input(s): GLUCAP in the last 168 hours. Time spent: 30 minutes  Signed:  Sahiba Granholm  [facility] 09/30/2016 , 8:17 AM

## 2016-11-08 ENCOUNTER — Observation Stay (HOSPITAL_COMMUNITY): Payer: BLUE CROSS/BLUE SHIELD

## 2016-11-08 ENCOUNTER — Inpatient Hospital Stay (HOSPITAL_COMMUNITY)
Admission: EM | Admit: 2016-11-08 | Discharge: 2016-11-11 | DRG: 191 | Disposition: A | Discharge status: Home Health | Payer: BLUE CROSS/BLUE SHIELD | Attending: Family Medicine | Admitting: Family Medicine

## 2016-11-08 ENCOUNTER — Encounter (HOSPITAL_COMMUNITY): Payer: Self-pay

## 2016-11-08 ENCOUNTER — Emergency Department (HOSPITAL_COMMUNITY): Payer: BLUE CROSS/BLUE SHIELD

## 2016-11-08 DIAGNOSIS — F10230 Alcohol dependence with withdrawal, uncomplicated: Secondary | ICD-10-CM | POA: Diagnosis not present

## 2016-11-08 DIAGNOSIS — R0789 Other chest pain: Secondary | ICD-10-CM

## 2016-11-08 DIAGNOSIS — E874 Mixed disorder of acid-base balance: Secondary | ICD-10-CM | POA: Diagnosis present

## 2016-11-08 DIAGNOSIS — R600 Localized edema: Secondary | ICD-10-CM | POA: Diagnosis present

## 2016-11-08 DIAGNOSIS — F10930 Alcohol use, unspecified with withdrawal, uncomplicated: Secondary | ICD-10-CM

## 2016-11-08 DIAGNOSIS — J9611 Chronic respiratory failure with hypoxia: Secondary | ICD-10-CM | POA: Diagnosis present

## 2016-11-08 DIAGNOSIS — Y900 Blood alcohol level of less than 20 mg/100 ml: Secondary | ICD-10-CM | POA: Diagnosis present

## 2016-11-08 DIAGNOSIS — R6510 Systemic inflammatory response syndrome (SIRS) of non-infectious origin without acute organ dysfunction: Secondary | ICD-10-CM

## 2016-11-08 DIAGNOSIS — R0600 Dyspnea, unspecified: Secondary | ICD-10-CM

## 2016-11-08 DIAGNOSIS — K760 Fatty (change of) liver, not elsewhere classified: Secondary | ICD-10-CM | POA: Diagnosis present

## 2016-11-08 DIAGNOSIS — J441 Chronic obstructive pulmonary disease with (acute) exacerbation: Secondary | ICD-10-CM | POA: Diagnosis not present

## 2016-11-08 DIAGNOSIS — R262 Difficulty in walking, not elsewhere classified: Secondary | ICD-10-CM | POA: Diagnosis present

## 2016-11-08 DIAGNOSIS — K7031 Alcoholic cirrhosis of liver with ascites: Secondary | ICD-10-CM | POA: Diagnosis present

## 2016-11-08 DIAGNOSIS — J9601 Acute respiratory failure with hypoxia: Secondary | ICD-10-CM

## 2016-11-08 DIAGNOSIS — R4701 Aphasia: Secondary | ICD-10-CM

## 2016-11-08 DIAGNOSIS — F419 Anxiety disorder, unspecified: Secondary | ICD-10-CM | POA: Diagnosis present

## 2016-11-08 DIAGNOSIS — F17210 Nicotine dependence, cigarettes, uncomplicated: Secondary | ICD-10-CM | POA: Diagnosis present

## 2016-11-08 DIAGNOSIS — I5032 Chronic diastolic (congestive) heart failure: Secondary | ICD-10-CM | POA: Diagnosis present

## 2016-11-08 DIAGNOSIS — I110 Hypertensive heart disease with heart failure: Secondary | ICD-10-CM | POA: Diagnosis present

## 2016-11-08 DIAGNOSIS — R410 Disorientation, unspecified: Secondary | ICD-10-CM | POA: Diagnosis present

## 2016-11-08 DIAGNOSIS — Z7952 Long term (current) use of systemic steroids: Secondary | ICD-10-CM

## 2016-11-08 DIAGNOSIS — Z8249 Family history of ischemic heart disease and other diseases of the circulatory system: Secondary | ICD-10-CM

## 2016-11-08 DIAGNOSIS — R071 Chest pain on breathing: Secondary | ICD-10-CM | POA: Diagnosis not present

## 2016-11-08 DIAGNOSIS — R140 Abdominal distension (gaseous): Secondary | ICD-10-CM

## 2016-11-08 DIAGNOSIS — Z9981 Dependence on supplemental oxygen: Secondary | ICD-10-CM

## 2016-11-08 DIAGNOSIS — Z66 Do not resuscitate: Secondary | ICD-10-CM | POA: Diagnosis present

## 2016-11-08 DIAGNOSIS — Z79899 Other long term (current) drug therapy: Secondary | ICD-10-CM

## 2016-11-08 DIAGNOSIS — I2510 Atherosclerotic heart disease of native coronary artery without angina pectoris: Secondary | ICD-10-CM | POA: Diagnosis present

## 2016-11-08 HISTORY — DX: Systemic inflammatory response syndrome (sirs) of non-infectious origin without acute organ dysfunction: R65.10

## 2016-11-08 LAB — D-DIMER, QUANTITATIVE: D-Dimer, Quant: 1.53 ug/mL-FEU — ABNORMAL HIGH (ref 0.00–0.50)

## 2016-11-08 LAB — ETHANOL: Alcohol, Ethyl (B): 9 mg/dL — ABNORMAL HIGH (ref <5)

## 2016-11-08 LAB — CBC WITH DIFFERENTIAL/PLATELET
BASOS ABS: 0 10*3/uL (ref 0.0–0.1)
BASOS PCT: 0 %
Eosinophils Absolute: 0.1 10*3/uL (ref 0.0–0.7)
Eosinophils Relative: 2 %
HEMATOCRIT: 35.5 % — AB (ref 39.0–52.0)
HEMOGLOBIN: 12.1 g/dL — AB (ref 13.0–17.0)
LYMPHS PCT: 25 %
Lymphs Abs: 1.9 10*3/uL (ref 0.7–4.0)
MCH: 33.8 pg (ref 26.0–34.0)
MCHC: 34.1 g/dL (ref 30.0–36.0)
MCV: 99.2 fL (ref 78.0–100.0)
Monocytes Absolute: 1.2 10*3/uL — ABNORMAL HIGH (ref 0.1–1.0)
Monocytes Relative: 16 %
NEUTROS ABS: 4.3 10*3/uL (ref 1.7–7.7)
NEUTROS PCT: 57 %
Platelets: 177 10*3/uL (ref 150–400)
RBC: 3.58 MIL/uL — ABNORMAL LOW (ref 4.22–5.81)
RDW: 12.6 % (ref 11.5–15.5)
WBC: 7.5 10*3/uL (ref 4.0–10.5)

## 2016-11-08 LAB — COMPREHENSIVE METABOLIC PANEL
ALBUMIN: 4.3 g/dL (ref 3.5–5.0)
ALK PHOS: 70 U/L (ref 38–126)
ALT: 98 U/L — ABNORMAL HIGH (ref 17–63)
AST: 95 U/L — AB (ref 15–41)
Anion gap: 15 (ref 5–15)
BILIRUBIN TOTAL: 1.8 mg/dL — AB (ref 0.3–1.2)
BUN: 24 mg/dL — AB (ref 6–20)
CALCIUM: 9 mg/dL (ref 8.9–10.3)
CO2: 23 mmol/L (ref 22–32)
CREATININE: 1.03 mg/dL (ref 0.61–1.24)
Chloride: 100 mmol/L — ABNORMAL LOW (ref 101–111)
GFR calc Af Amer: >60 mL/min (ref >60)
GLUCOSE: 105 mg/dL — AB (ref 65–99)
Potassium: 4.2 mmol/L (ref 3.5–5.1)
Sodium: 138 mmol/L (ref 135–145)
Total Protein: 6.6 g/dL (ref 6.5–8.1)

## 2016-11-08 LAB — I-STAT VENOUS BLOOD GAS, ED
Acid-Base Excess: 5 mmol/L — ABNORMAL HIGH (ref 0.0–2.0)
BICARBONATE: 28.3 mmol/L — AB (ref 20.0–28.0)
O2 Saturation: 88 %
PCO2 VEN: 37.5 mmHg — AB (ref 44.0–60.0)
PH VEN: 7.486 — AB (ref 7.250–7.430)
TCO2: 29 mmol/L (ref 0–100)
pO2, Ven: 50 mmHg — ABNORMAL HIGH (ref 32.0–45.0)

## 2016-11-08 LAB — URINALYSIS, ROUTINE W REFLEX MICROSCOPIC
Bilirubin Urine: NEGATIVE
GLUCOSE, UA: NEGATIVE mg/dL
Hgb urine dipstick: NEGATIVE
Ketones, ur: 5 mg/dL — AB
Leukocytes, UA: NEGATIVE
NITRITE: NEGATIVE
PH: 5 (ref 5.0–8.0)
PROTEIN: NEGATIVE mg/dL
Specific Gravity, Urine: 1.02 (ref 1.005–1.030)

## 2016-11-08 LAB — MRSA PCR SCREENING: MRSA BY PCR: NEGATIVE

## 2016-11-08 LAB — RAPID URINE DRUG SCREEN, HOSP PERFORMED
AMPHETAMINES: NOT DETECTED
BARBITURATES: NOT DETECTED
Benzodiazepines: NOT DETECTED
Cocaine: NOT DETECTED
Opiates: NOT DETECTED
TETRAHYDROCANNABINOL: NOT DETECTED

## 2016-11-08 LAB — BRAIN NATRIURETIC PEPTIDE: B NATRIURETIC PEPTIDE 5: 29.3 pg/mL (ref 0.0–100.0)

## 2016-11-08 LAB — AMMONIA: AMMONIA: 42 umol/L — AB (ref 9–35)

## 2016-11-08 LAB — I-STAT CG4 LACTIC ACID, ED
Lactic Acid, Venous: 3.18 mmol/L (ref 0.5–1.9)
Lactic Acid, Venous: 2.66 mmol/L (ref 0.5–1.9)

## 2016-11-08 LAB — TROPONIN I
Troponin I: <0.03 ng/mL (ref <0.03)
Troponin I: <0.03 ng/mL (ref <0.03)

## 2016-11-08 LAB — I-STAT TROPONIN, ED: Troponin i, poc: 0.01 ng/mL (ref 0.00–0.08)

## 2016-11-08 MED ORDER — METHYLPREDNISOLONE SODIUM SUCC 125 MG IJ SOLR: 125.0000 mg | Freq: Once | INTRAMUSCULAR | Status: DC

## 2016-11-08 MED ORDER — LORAZEPAM 2 MG/ML IJ SOLN
1.0000 mg | Freq: Once | INTRAMUSCULAR | Status: AC
Start: 2016-11-08 — End: 2016-11-08
  Administered 2016-11-08: 1 mg via INTRAVENOUS
  Filled 2016-11-08: qty 1

## 2016-11-08 MED ORDER — POLYETHYLENE GLYCOL 3350 17 G PO PACK: 17.0000 g | PACK | Freq: Every day | ORAL | Status: DC | PRN

## 2016-11-08 MED ORDER — LORAZEPAM 2 MG/ML IJ SOLN
1.0000 mg | Freq: Once | INTRAMUSCULAR | Status: AC
  Administered 2016-11-08: 1 mg via INTRAVENOUS
  Filled 2016-11-08: qty 1

## 2016-11-08 MED ORDER — SODIUM CHLORIDE 0.9% FLUSH
3.0000 mL | Freq: Two times a day (BID) | INTRAVENOUS | Status: DC
Start: 2016-11-08 — End: 2016-11-11
  Administered 2016-11-08 – 2016-11-11 (×6): 3 mL via INTRAVENOUS

## 2016-11-08 MED ORDER — ENOXAPARIN SODIUM 40 MG/0.4ML ~~LOC~~ SOLN
40.0000 mg | SUBCUTANEOUS | Status: DC
  Administered 2016-11-08: 40 mg via SUBCUTANEOUS
  Filled 2016-11-08: qty 0.4

## 2016-11-08 MED ORDER — ONDANSETRON HCL 4 MG PO TABS: 4.0000 mg | ORAL_TABLET | Freq: Four times a day (QID) | ORAL | Status: DC | PRN

## 2016-11-08 MED ORDER — IBUPROFEN 400 MG PO TABS: 400.0000 mg | ORAL_TABLET | Freq: Four times a day (QID) | ORAL | Status: DC | PRN

## 2016-11-08 MED ORDER — VANCOMYCIN HCL 10 G IV SOLR
1500.0000 mg | Freq: Once | INTRAVENOUS | Status: AC
  Administered 2016-11-08: 1500 mg via INTRAVENOUS
  Filled 2016-11-08: qty 1500

## 2016-11-08 MED ORDER — MAGNESIUM SULFATE 2 GM/50ML IV SOLN
2.0000 g | Freq: Once | INTRAVENOUS | Status: AC
  Administered 2016-11-08: 2 g via INTRAVENOUS
  Filled 2016-11-08: qty 50

## 2016-11-08 MED ORDER — IOPAMIDOL (ISOVUE-370) INJECTION 76%
INTRAVENOUS | Status: AC
  Administered 2016-11-08: 100 mL
  Filled 2016-11-08: qty 100

## 2016-11-08 MED ORDER — TIOTROPIUM BROMIDE MONOHYDRATE 18 MCG IN CAPS
18.0000 ug | ORAL_CAPSULE | Freq: Every day | RESPIRATORY_TRACT | Status: DC
  Administered 2016-11-09 – 2016-11-11 (×2): 18 ug via RESPIRATORY_TRACT
  Filled 2016-11-08: qty 5

## 2016-11-08 MED ORDER — IPRATROPIUM BROMIDE 0.02 % IN SOLN
1.0000 mg | Freq: Once | RESPIRATORY_TRACT | Status: AC
  Administered 2016-11-08: 1 mg via RESPIRATORY_TRACT
  Filled 2016-11-08: qty 5

## 2016-11-08 MED ORDER — THIAMINE HCL 100 MG/ML IJ SOLN
Freq: Once | INTRAVENOUS | Status: AC
  Administered 2016-11-08: 14:00:00 via INTRAVENOUS
  Filled 2016-11-08: qty 1000

## 2016-11-08 MED ORDER — FLUTICASONE FUROATE 200 MCG/ACT IN AEPB: 1.0000 | INHALATION_SPRAY | RESPIRATORY_TRACT | Status: DC

## 2016-11-08 MED ORDER — ALBUTEROL SULFATE (2.5 MG/3ML) 0.083% IN NEBU: 2.5000 mg | INHALATION_SOLUTION | Freq: Four times a day (QID) | RESPIRATORY_TRACT | Status: DC | PRN

## 2016-11-08 MED ORDER — PIPERACILLIN-TAZOBACTAM 3.375 G IVPB
3.3750 g | Freq: Three times a day (TID) | INTRAVENOUS | Status: DC
  Administered 2016-11-08 – 2016-11-10 (×5): 3.375 g via INTRAVENOUS
  Filled 2016-11-08 (×8): qty 50

## 2016-11-08 MED ORDER — ALBUTEROL SULFATE HFA 108 (90 BASE) MCG/ACT IN AERS: 2.0000 | INHALATION_SPRAY | RESPIRATORY_TRACT | Status: DC | PRN

## 2016-11-08 MED ORDER — SODIUM CHLORIDE 0.9 % IV BOLUS (SEPSIS)
1000.0000 mL | Freq: Once | INTRAVENOUS | Status: AC
  Administered 2016-11-08: 1000 mL via INTRAVENOUS

## 2016-11-08 MED ORDER — LORAZEPAM 2 MG/ML IJ SOLN
2.0000 mg | INTRAMUSCULAR | Status: DC | PRN
Start: 2016-11-08 — End: 2016-11-11
  Administered 2016-11-09 – 2016-11-10 (×3): 2 mg via INTRAVENOUS
  Filled 2016-11-08 (×3): qty 1

## 2016-11-08 MED ORDER — FUROSEMIDE 10 MG/ML IJ SOLN: 40.0000 mg | Freq: Every day | INTRAMUSCULAR | Status: DC

## 2016-11-08 MED ORDER — ALBUTEROL (5 MG/ML) CONTINUOUS INHALATION SOLN
10.0000 mg/h | INHALATION_SOLUTION | Freq: Once | RESPIRATORY_TRACT | Status: AC
  Administered 2016-11-08: 10 mg/h via RESPIRATORY_TRACT
  Filled 2016-11-08: qty 20

## 2016-11-08 MED ORDER — MOMETASONE FURO-FORMOTEROL FUM 200-5 MCG/ACT IN AERO
2.0000 | INHALATION_SPRAY | Freq: Two times a day (BID) | RESPIRATORY_TRACT | Status: DC
  Administered 2016-11-08 – 2016-11-11 (×5): 2 via RESPIRATORY_TRACT
  Filled 2016-11-08 (×2): qty 8.8

## 2016-11-08 MED ORDER — VANCOMYCIN HCL IN DEXTROSE 1-5 GM/200ML-% IV SOLN
1000.0000 mg | Freq: Two times a day (BID) | INTRAVENOUS | Status: DC
  Administered 2016-11-09 – 2016-11-10 (×3): 1000 mg via INTRAVENOUS
  Filled 2016-11-08 (×4): qty 200

## 2016-11-08 MED ORDER — ONDANSETRON HCL 4 MG/2ML IJ SOLN: 4.0000 mg | Freq: Four times a day (QID) | INTRAMUSCULAR | Status: DC | PRN

## 2016-11-08 MED ORDER — BUDESONIDE 0.25 MG/2ML IN SUSP: 0.2500 mg | Freq: Two times a day (BID) | RESPIRATORY_TRACT | Status: DC

## 2016-11-08 MED ORDER — PIPERACILLIN-TAZOBACTAM 3.375 G IVPB 30 MIN
3.3750 g | Freq: Once | INTRAVENOUS | Status: AC
  Administered 2016-11-08: 3.375 g via INTRAVENOUS
  Filled 2016-11-08: qty 50

## 2016-11-08 MED ORDER — VANCOMYCIN HCL IN DEXTROSE 1-5 GM/200ML-% IV SOLN: 1000.0000 mg | Freq: Once | INTRAVENOUS | Status: DC

## 2016-11-08 MED ORDER — IPRATROPIUM-ALBUTEROL 0.5-2.5 (3) MG/3ML IN SOLN: 3.0000 mL | RESPIRATORY_TRACT | Status: DC | PRN

## 2016-11-08 MED ORDER — IPRATROPIUM-ALBUTEROL 0.5-2.5 (3) MG/3ML IN SOLN
3.0000 mL | Freq: Four times a day (QID) | RESPIRATORY_TRACT | Status: DC
  Administered 2016-11-08 – 2016-11-09 (×3): 3 mL via RESPIRATORY_TRACT
  Filled 2016-11-08 (×3): qty 3

## 2016-11-08 MED ORDER — FUROSEMIDE 10 MG/ML IJ SOLN
40.0000 mg | Freq: Two times a day (BID) | INTRAMUSCULAR | Status: DC
  Administered 2016-11-09 – 2016-11-11 (×5): 40 mg via INTRAVENOUS
  Filled 2016-11-08 (×6): qty 4

## 2016-11-08 MED ORDER — LOSARTAN POTASSIUM 50 MG PO TABS
50.0000 mg | ORAL_TABLET | ORAL | Status: DC
  Administered 2016-11-09 – 2016-11-11 (×3): 50 mg via ORAL
  Filled 2016-11-08 (×3): qty 1

## 2016-11-08 NOTE — H&P (Signed)
[facility] Admission History and Physical Service Pager: [phone]  Patient name: Gabriel Stafford Medical record number: [id number] Date of birth: 1946-02-11 Age: 71 y.o. Gender: male  Primary Care Provider: System, Pcp Not In Consultants: none Code Status: DNR/DNI  Chief Complaint: weakness, SOB  Assessment and Plan: Gabriel Stafford is a 71 y.o. male presenting with weakness and SOB. PMH is significant for COPD on 4L O2, CHF with EF of 50%, HTN, tobacco abuse and alcohol abuse.  SIRS without clear source- initially with tachycardia, tachypnea, patient reports AMS at home. No fever. BP stable. WBC normal. Lactic acid elevated to 3.18. CT head negative. CXR negative for acute process. UA with ketones, negative for leukocytes and nitrites. No clear source of infection. Lactic acidosis could be related to liver dysfunction from alcohol abuse as below coupled with severe dehydration as patient has had little to no PO intake for the past 2-3 days. Patient feels improved after breathing treatments and ativan for withdrawal however remains tachycardic. Started on IV antibiotics in ED per sepsis protocol.  -admit to Step-down unit, attending Dr. Andria Frames -vitals per floor protocol -up with assistance -continue vanc and zosyn -blood and urine cultures pending -de-escalate antibiotics as able -repeat lactic acid to ensure this is trending down -trend troponins -d-dimer, if elevated will get CTA to rule out PE -obtain UDS -PT/OT consult for global weakness, lives alone  Alcohol withdrawal- per patient, drinks 2 beers or 2 whisky drinks a day. Per ED provider has a more extensive hx of alcohol use as told to provider by patient's friend. From chart review, history of family endorsing alcohol abuse and patient downplaying how much alcohol he consumes. Had stopped drinking 2-3 days ago due to feeling unwell. Ammonia 42, Ethanol level 9. AST ALT elevated to 95, 98 respectively.  Suspect some level of alcoholic cirrhosis is present as patient has distended abdomen on exam. Denies ever having a paracentesis or following with GI and also denies abdominal pain. Received 3 mg of Ativan in ED with improvement in tremulousness.  -monitor on CIWA  -ativan per stepdown protocol -s/p thiamine and folic acid in ED -consider abdominal US to assess liver and amount of peritoneal fluid  -consider checking coags -obtain UDS  COPD with acute exacerbation- on 4L O2 at baseline. Briefly required BiPaP in ED but could not tolerate mask. ABG 7.486, O2 50, CO2 37.5, Bicarb 28, consistent with respiratory alkalosis in setting of tachypnea. Has been using inhalers at home as prescribed. Smokes about 2 cigarettes a day (a pack lasts 2 weeks). CXR clear. Currently on home oxygen level. Takes arnuity ellipta and stiolto respimat. Received atrovent, mag, and 125 mg of solumedrol in ED. -antibiotics as above  -duonebs q3 hours prn -albuterol nebs q4 hours as needed -will start dulera and spiriva as in-patient substitutes for home laba, lama and ICS -start steroid course of prednisone 50 for 5 days  HFrEF- last Echo January with EF of 50% with right atrial pressure of 5 mmHg with mild dilation, currently with lower extremity edema past knees. No crackles auscultated. BNP normal at 29.3. Received over a liter of fluids in ED per sepsis protocol. Takes 40mg  Lasix BID and also had 40 mg BID of torsemide on home med list. Reports a 8-10 pound weight gain recently. EKG reports a fib but p waves present.  -diurese with IV lasix 40 mg BID given significant LE edema, titrate as needed -daily weights -strict I&Os  HTN- takes losartan at home,  BP stable in ED -continue home losartan  FEN/GI: heart healthy diet Prophylaxis: lovenox   Disposition: pending clinical improvement  History of Present Illness:  Gabriel Stafford is a 71 y.o. male presenting with shortness of breath and weakness. PMH significant  for COPD on 4L O2, CHF, HTN, and alcohol abuse.   Patient endorses being scared something was wrong with his heart or having strokes but did not want to take the time to come to the ED until today after urging from his son.  Patient reports he woke up Saturday morning with swelling of his feet. He remembers babbling at TV and having difficulty saying what he wanted to say, so he started trying to write notes but had difficulty. Also had trouble walking. Had about 5-6 episodes between then and day of admission but says he had periods of feeling normal in between. He also reports some difficulty breathing over the weekend but had no increase in home O2. He is concerned about "mini strokes." Some associated nausea. He still is having difficulty with walking. He thought he had a fever this morning because the triage nurse told him he felt warm. Not coughing but endorses some wheezing. No episodes of nonsensical talking today, per patient. Describes trouble walking because of trouble breathing and feeling very weak. Has had hand shaking that is worse recently, noticed again over the weekend. He feels his heart is racing. Denies chest pain, falls, changes in vision, headaches.   Per patient he drinks "2 drinks a day" either 2 beers or 2 whisky drinks. He may drink more than that on weekends "3 instead of 2". Last had a drink last Saturday or Sunday because he was feeling unwell. Denies ever having a seizure.   Patient feels breathing has improved since arrival to ED. Improved with breathing treatments.  Review Of Systems: Per HPI with the following additions:  Review of Systems  Constitutional: Positive for fever.  Eyes: Negative for blurred vision and double vision.  Respiratory: Positive for shortness of breath and wheezing. Negative for cough.   Cardiovascular: Negative for chest pain.  Gastrointestinal: Positive for abdominal pain (when fluid builds up) and nausea. Negative for vomiting.   Genitourinary: Negative for dysuria.  Musculoskeletal: Negative for falls.  Neurological: Positive for dizziness, tremors and headaches. Negative for focal weakness.   Patient Active Problem List   Diagnosis Date Noted  . SIRS (systemic inflammatory response syndrome) ([place]) 11/08/2016  . COPD with acute exacerbation ([place]) 09/27/2016  . CHF (congestive heart failure) ([place]) 09/27/2016  . HTN (hypertension), benign 09/27/2016  . Acute respiratory failure with hypoxia ([place]) 09/27/2016    Past Medical History: Past Medical History:  Diagnosis Date  . CHF (congestive heart failure) ([place])   . COPD (chronic obstructive pulmonary disease) ([place])     Past Surgical History: Past Surgical History:  Procedure Laterality Date  . CYST REMOVAL NECK      Social History: Social History  Substance Use Topics  . Smoking status: Current Some Day Smoker    Types: Cigarettes  . Smokeless tobacco: Never Used  . Alcohol use Yes   Additional social history: lives alone. Alcohol use, reports 2 Midge Minium drinks or beers everyday (may have 3 if with friends). Lives by himself at home but planning on having someone stay with him (ex offered, though he refused). Feels like he needs help keeping house clean but denies trouble taking medication. Smokes 1 pack in 2 weeks. Denies drug use.  Please also refer to  relevant sections of EMR.  Family History: Mother died from heart disease, CHF. Younger brother (deceased) had lung cancer and CHF. Father had heart attacks.   Allergies and Medications: No Known Allergies No current facility-administered medications on file prior to encounter.    Current Outpatient Prescriptions on File Prior to Encounter  Medication Sig Dispense Refill  . ARNUITY ELLIPTA 200 MCG/ACT AEPB Take 1 puff by mouth every morning.  1  . folic acid (FOLVITE) 1 MG tablet Take 1 tablet (1 mg total) by mouth daily. 30 tablet 0  . ipratropium-albuterol (DUONEB) 0.5-2.5 (3) MG/3ML SOLN  Take 3 mLs by nebulization every 4 (four) hours as needed for wheezing or shortness of breath.  1  . losartan (COZAAR) 50 MG tablet Take 50 mg by mouth every morning.  0  . STIOLTO RESPIMAT 2.5-2.5 MCG/ACT AERS Take 2 puffs by mouth every morning.  1  . potassium chloride SA (K-DUR,KLOR-CON) 20 MEQ tablet Take 2 tablets (40 mEq total) by mouth daily. (Patient not taking: Reported on 11/08/2016) 60 tablet 0  . predniSONE (DELTASONE) 10 MG tablet Take 40mg  daily for 3days,Take 30mg  daily for 3days,Take 20mg  daily for 3days,Take 10mg  daily for 3days, then stop 30 tablet 0  . torsemide (DEMADEX) 20 MG tablet Take 2 tablets (40 mg total) by mouth 2 (two) times daily. For 5 days then take 40 mg daily. 60 tablet 0    Objective: BP 120/77   Pulse (!) 124   Temp 98.3 F (36.8 C) (Oral)   Resp 20   Wt 194 lb 10.7 oz (88.3 kg)   SpO2 97%   BMI 28.75 kg/m  Exam: General: elderly man sitting up in bed, tremulous, in NAD, [place] in place Eyes: PERRL, EOMI, no scleral icterus or conjunctival injection ENTM: dry mucous membranes, no erythema or exudate in oropharynx Neck: supple, non-tender, no LAD Cardiovascular: tachycardic, no MRG Respiratory: decreased air movement bilaterally. Using accessory muscles to breath (subcostal). Expiratory wheezes with prolonged expiratory phase.  Gastrointestinal: soft. Distended abdomen. Non-tender. No fluid wave. +BS. Tympanic.  MSK: +2 pitting edema up to knees bilaterally.  Derm: dry, no rashes or lesions noted Neuro: AOx3, CN2-12 intact, strength 5/5 in upper and lower extremities bilaterally, sensation intact throughout. Tremor present in upper extremities bilaterally. No asterixis.  Psych: appropriate mood and affect  Labs and Imaging: CBC BMET   Recent Labs Lab 11/08/16 0938  WBC 7.5  HGB 12.1*  HCT 35.5*  PLT 177    Recent Labs Lab 11/08/16 0938  NA 138  K 4.2  CL 100*  CO2 23  BUN 24*  CREATININE 1.03  GLUCOSE 105*  CALCIUM 9.0     Lactic  acid 3.2  Ct Head Wo Contrast  Result Date: 11/08/2016 CLINICAL DATA:  Altered mental status/confusion EXAM: CT HEAD WITHOUT CONTRAST TECHNIQUE: Contiguous axial images were obtained from the base of the skull through the vertex without intravenous contrast. COMPARISON:  None. FINDINGS: Brain: Ventricles and sulci appear within normal limits for age. There is no intracranial mass, hemorrhage, extra-axial fluid collection, or midline shift. Gray-white compartments appear normal. No acute infarct is demonstrable. Vascular: There is no hyperdense vessel. There is no appreciable vascular calcification. Skull: Bony calvarium appears intact. Sinuses/Orbits: Visualized paranasal sinuses are clear. Orbits appear symmetric bilaterally. Other: Mastoid air cells are clear. IMPRESSION: Study within normal limits for age. Electronically Signed   By: Lowella Grip III M.D.   On: 11/08/2016 11:52   Dg Chest Portable 1 View  Result  Date: 11/08/2016 CLINICAL DATA:  71 year old presenting with acute onset of chest pain and shortness of breath. Current history of COPD, hypertension and CHF. Current smoker. EXAM: PORTABLE CHEST 1 VIEW COMPARISON:  Chest x-ray and CTA chest 09/27/2016. FINDINGS: Cardiac silhouette normal in size for AP technique, unchanged. Thoracic aorta atherosclerotic, unchanged. Hilar and mediastinal contours otherwise unremarkable. Emphysematous changes throughout both lungs as noted previously. Lungs clear. No pneumothorax. Pulmonary vascularity normal. No visible pleural effusions. IMPRESSION: COPD/emphysema.  No acute cardiopulmonary disease. Electronically Signed   By: Evangeline Dakin M.D.   On: 11/08/2016 10:14     Steve Rattler, DO 11/08/2016, 2:37 PM PGY-1, [place] Intern pager: [phone], text pages welcome  Upper Level Addendum:  I have seen and evaluated this patient along with Dr. Vanetta Shawl and reviewed the above note, making necessary revisions in  green.  Rogue Bussing, MD PGY-2,  [facility] 11/08/2016 2:42 PM

## 2016-11-08 NOTE — ED Notes (Signed)
Contacted admitting provider regarding level of care.  Admitting provider states patient will be stepdown for at least one night due to concern for withdrawal.

## 2016-11-08 NOTE — Progress Notes (Signed)
Pt taken off Bipap at 1105 due to pt saying he can no longer tolerate the mask being on. Pt appears to be in no distress at this time. Back to 4L  per home regimen. RT will continue to follow, RN at bedside.

## 2016-11-08 NOTE — Progress Notes (Signed)
Pharmacy Antibiotic Note  Gabriel Stafford is a 71 y.o. male admitted on 11/08/2016 with sepsis.  Pharmacy has been consulted for vancomycin and zosyn dosing. Pt is afebrile and WBC is WNL. SCr is WNL. Lactic acid is elevated at 3.18.   Plan: Vancomycin 1500mg  IV x 1 then 1gm IV Q12H Zosyn 3.375gm IV Q8H (4 hr inf) F/u renal fxn, C&S, clinical status and trough at SS  Weight: 194 lb 10.7 oz (88.3 kg)  Temp (24hrs), Avg:98.3 F (36.8 C), Min:98.3 F (36.8 C), Max:98.3 F (36.8 C)   Recent Labs Lab 11/08/16 0938 11/08/16 1030  WBC 7.5  --   CREATININE 1.03  --   LATICACIDVEN  --  3.18*    Estimated Creatinine Clearance: 70.2 mL/min (by C-G formula based on SCr of 1.03 mg/dL).    No Known Allergies  Antimicrobials this admission: Vanc 6/5>> Zosyn 6/5>>  Dose adjustments this admission: N/A  Microbiology results: Pending  Thank you for allowing pharmacy to be a part of this patient's care.  Siarra Gilkerson, Rande Lawman 11/08/2016 10:45 AM

## 2016-11-08 NOTE — ED Provider Notes (Signed)
[facility] DEPT Provider Note   CSN: [id number] Arrival date & time: 11/08/16  0930     History   Chief Complaint Chief Complaint  Patient presents with  . Shortness of Breath    HPI Gabriel Stafford is a 71 y.o. male.  HPI   71 year old male with history of CHF, COPD, chronic respiratory failure, who presents with shortness of breath and confusion. According to the patient, he has felt increasingly weak over the last several days. He has had increasing cough, shortness of breath, and nausea. Of note, family has arrived and also states that he has had decreased responsiveness and increased confusion. Over the last 24 hours, he has developed significant shaking of his bilateral upper and lower extremities with increased confusion. Patient initially declined alcohol but on family arrival, states that he does drink large amounts of liquor daily. He has decreased his amount because he has felt unwell for the last several days.  Level 5 caveat invoked as remainder of history, ROS, and physical exam limited due to patient's increased WOB.   Past Medical History:  Diagnosis Date  . CHF (congestive heart failure) ([place])   . COPD (chronic obstructive pulmonary disease) [facility])     Patient Active Problem List   Diagnosis Date Noted  . SIRS (systemic inflammatory response syndrome) ([place]) 11/08/2016  . COPD with acute exacerbation ([place]) 09/27/2016  . CHF (congestive heart failure) ([place]) 09/27/2016  . HTN (hypertension), benign 09/27/2016  . Acute respiratory failure with hypoxia ([place]) 09/27/2016    Past Surgical History:  Procedure Laterality Date  . CYST REMOVAL NECK         Home Medications    Prior to Admission medications   Medication Sig Start Date End Date Taking? Authorizing Provider  ARNUITY ELLIPTA 200 MCG/ACT AEPB Take 1 puff by mouth every morning. 09/14/16  Yes [provider]  folic acid (FOLVITE) 1 MG tablet Take 1 tablet (1 mg total) by mouth daily.  10/01/16  Yes Lavina Hamman, MD  furosemide (LASIX) 40 MG tablet Take 40 mg by mouth 2 (two) times daily.   Yes [provider]  ipratropium-albuterol (DUONEB) 0.5-2.5 (3) MG/3ML SOLN Take 3 mLs by nebulization every 4 (four) hours as needed for wheezing or shortness of breath. 09/07/16  Yes [provider]  losartan (COZAAR) 50 MG tablet Take 50 mg by mouth every morning. 09/15/16  Yes [provider]  A[place]stan 2.5-2.5 MCG/ACT AERS Take 2 puffs by mouth every morning. 09/14/16  Yes [provider]  potassium chloride SA (K-DUR,KLOR-CON) 20 MEQ tablet Take 2 tablets (40 mEq total) by mouth daily. Patient not taking: Reported on 11/08/2016 09/30/16   Lavina Hamman, MD  predniSONE (DELTASONE) 10 MG tablet Take 40mg  daily for 3days,Take 30mg  daily for 3days,Take 20mg  daily for 3days,Take 10mg  daily for 3days, then stop 10/01/16   Lavina Hamman, MD  torsemide (DEMADEX) 20 MG tablet Take 2 tablets (40 mg total) by mouth 2 (two) times daily. For 5 days then take 40 mg daily. 09/30/16   Lavina Hamman, MD    Family History History reviewed. No pertinent family history.  Social History Social History  Substance Use Topics  . Smoking status: Current Some Day Smoker    Types: Cigarettes  . Smokeless tobacco: Never Used  . Alcohol use Yes     Allergies   Patient has no known allergies.   Review of Systems Review of Systems  Constitutional: Positive for fatigue.  Respiratory: Positive  for cough, shortness of breath and wheezing.   Neurological: Positive for tremors and weakness.  Psychiatric/Behavioral: Positive for confusion.  All other systems reviewed and are negative.    Physical Exam Updated Vital Signs BP 120/77   Pulse (!) 132   Temp 98.3 F (36.8 C) (Oral)   Resp (!) 28   Wt 88.3 kg (194 lb 10.7 oz)   SpO2 98%   BMI 28.75 kg/m   Physical Exam  Constitutional: He is oriented to person, place, and time. He appears well-developed  and well-nourished. He appears distressed.  HENT:  Head: Normocephalic and atraumatic.  Mouth/Throat: Oropharynx is clear and moist.  Eyes: Conjunctivae are normal.  Neck: Neck supple.  Cardiovascular: Regular rhythm and normal heart sounds.  Tachycardia present.  Exam reveals no friction rub.   No murmur heard. Pulmonary/Chest: Effort normal. Tachypnea noted. No respiratory distress. He has wheezes. He has no rales.  Abdominal: He exhibits no distension.  Musculoskeletal: He exhibits no edema.  Neurological: He is alert and oriented to person, place, and time. He displays tremor (high frequency, UE>LE). He displays normal reflexes. He exhibits normal muscle tone. GCS eye subscore is 4. GCS verbal subscore is 5. GCS motor subscore is 6.  Skin: Skin is warm. Capillary refill takes less than 2 seconds.  Psychiatric: He has a normal mood and affect.  Nursing note and vitals reviewed.    ED Treatments / Results  Labs (all labs ordered are listed, but only abnormal results are displayed) Labs Reviewed  CBC WITH DIFFERENTIAL/PLATELET - Abnormal; Notable for the following:       Result Value   RBC 3.58 (*)    Hemoglobin 12.1 (*)    HCT 35.5 (*)    Monocytes Absolute 1.2 (*)    All other components within normal limits  COMPREHENSIVE METABOLIC PANEL - Abnormal; Notable for the following:    Chloride 100 (*)    Glucose, Bld 105 (*)    BUN 24 (*)    AST 95 (*)    ALT 98 (*)    Total Bilirubin 1.8 (*)    All other components within normal limits  ETHANOL - Abnormal; Notable for the following:    Alcohol, Ethyl (B) 9 (*)    All other components within normal limits  AMMONIA - Abnormal; Notable for the following:    Ammonia 42 (*)    All other components within normal limits  URINALYSIS, ROUTINE W REFLEX MICROSCOPIC - Abnormal; Notable for the following:    APPearance HAZY (*)    Ketones, ur 5 (*)    All other components within normal limits  D-DIMER, QUANTITATIVE (NOT AT [facility]) -  Abnormal; Notable for the following:    D-Dimer, Quant 1.53 (*)    All other components within normal limits  I-STAT VENOUS BLOOD GAS, ED - Abnormal; Notable for the following:    pH, Ven 7.486 (*)    pCO2, Ven 37.5 (*)    pO2, Ven 50.0 (*)    Bicarbonate 28.3 (*)    Acid-Base Excess 5.0 (*)    All other components within normal limits  I-STAT CG4 LACTIC ACID, ED - Abnormal; Notable for the following:    Lactic Acid, Venous 3.18 (*)    All other components within normal limits  I-STAT CG4 LACTIC ACID, ED - Abnormal; Notable for the following:    Lactic Acid, Venous 2.66 (*)    All other components within normal limits  CULTURE, BLOOD (ROUTINE X 2)  CULTURE,  BLOOD (ROUTINE X 2)  URINE CULTURE  MRSA PCR SCREENING  BRAIN NATRIURETIC PEPTIDE  TROPONIN I  TROPONIN I  TROPONIN I  RAPID URINE DRUG SCREEN, HOSP PERFORMED  I-STAT TROPOININ, ED    EKG  EKG Interpretation  Date/Time:  Tuesday November 08 2016 09:32:51 EDT Ventricular Rate:  129 PR Interval:    QRS Duration: 82 QT Interval:  277 QTC Calculation: 406 R Axis:   76 Text Interpretation:  Sinus tachycardia Paired ventricular premature complexes Abnormal R-wave progression, early transition Baseline wander in lead(s) I II III aVR aVF V2 V3 V4 V6 No apparent ST elevations Confirmed by Duffy Bruce [phone]) on 11/08/2016 3:58:45 PM       Radiology Ct Head Wo Contrast  Result Date: 11/08/2016 CLINICAL DATA:  Altered mental status/confusion EXAM: CT HEAD WITHOUT CONTRAST TECHNIQUE: Contiguous axial images were obtained from the base of the skull through the vertex without intravenous contrast. COMPARISON:  None. FINDINGS: Brain: Ventricles and sulci appear within normal limits for age. There is no intracranial mass, hemorrhage, extra-axial fluid collection, or midline shift. Gray-white compartments appear normal. No acute infarct is demonstrable. Vascular: There is no hyperdense vessel. There is no appreciable vascular  calcification. Skull: Bony calvarium appears intact. Sinuses/Orbits: Visualized paranasal sinuses are clear. Orbits appear symmetric bilaterally. Other: Mastoid air cells are clear. IMPRESSION: Study within normal limits for age. Electronically Signed   By: Lowella Grip III M.D.   On: 11/08/2016 11:52   Dg Chest Portable 1 View  Result Date: 11/08/2016 CLINICAL DATA:  71 year old presenting with acute onset of chest pain and shortness of breath. Current history of COPD, hypertension and CHF. Current smoker. EXAM: PORTABLE CHEST 1 VIEW COMPARISON:  Chest x-ray and CTA chest 09/27/2016. FINDINGS: Cardiac silhouette normal in size for AP technique, unchanged. Thoracic aorta atherosclerotic, unchanged. Hilar and mediastinal contours otherwise unremarkable. Emphysematous changes throughout both lungs as noted previously. Lungs clear. No pneumothorax. Pulmonary vascularity normal. No visible pleural effusions. IMPRESSION: COPD/emphysema.  No acute cardiopulmonary disease. Electronically Signed   By: Evangeline Dakin M.D.   On: 11/08/2016 10:14    Procedures .Critical Care Performed by: Duffy Bruce Authorized by: Duffy Bruce   Critical care provider statement:    Critical care time (minutes):  35   (including critical care time)  CRITICAL CARE Performed by: Evonnie Pat   Total critical care time: 35 minutes  Critical care time was exclusive of separately billable procedures and treating other patients.  Critical care was necessary to treat or prevent imminent or life-threatening deterioration.  Critical care was time spent personally by me on the following activities: development of treatment plan with patient and/or surrogate as well as nursing, discussions with consultants, evaluation of patient's response to treatment, examination of patient, obtaining history from patient or surrogate, ordering and performing treatments and interventions, ordering and review of laboratory  studies, ordering and review of radiographic studies, pulse oximetry and re-evaluation of patient's condition.    Medications Ordered in ED Medications  methylPREDNISolone sodium succinate (SOLU-MEDROL) 125 mg/2 mL injection 125 mg (125 mg Intravenous Not Given 11/08/16 1037)  piperacillin-tazobactam (ZOSYN) IVPB 3.375 g (not administered)  vancomycin (VANCOCIN) IVPB 1000 mg/200 mL premix (not administered)  ipratropium-albuterol (DUONEB) 0.5-2.5 (3) MG/3ML nebulizer solution 3 mL (not administered)  losartan (COZAAR) tablet 50 mg (not administered)  enoxaparin (LOVENOX) injection 40 mg (40 mg Subcutaneous Given 11/08/16 1516)  sodium chloride flush (NS) 0.9 % injection 3 mL (3 mLs Intravenous Given 11/08/16 1517)  ibuprofen (ADVIL,MOTRIN) tablet  400 mg (not administered)  polyethylene glycol (MIRALAX / GLYCOLAX) packet 17 g (not administered)  ondansetron (ZOFRAN) tablet 4 mg (not administered)    Or  ondansetron (ZOFRAN) injection 4 mg (not administered)  LORazepam (ATIVAN) injection 2-3 mg (not administered)  budesonide (PULMICORT) nebulizer solution 0.25 mg (not administered)  furosemide (LASIX) injection 40 mg (not administered)  albuterol (PROVENTIL,VENTOLIN) solution continuous neb (10 mg/hr Nebulization Given 11/08/16 0955)  ipratropium (ATROVENT) nebulizer solution 1 mg (1 mg Nebulization Given 11/08/16 0955)  magnesium sulfate IVPB 2 g 50 mL (0 g Intravenous Stopped 11/08/16 1153)  LORazepam (ATIVAN) injection 1 mg (1 mg Intravenous Given 11/08/16 1111)  sodium chloride 0.9 % 1,000 mL with thiamine 354 mg, folic acid 1 mg, multivitamins adult 10 mL infusion ( Intravenous New Bag/Given 11/08/16 1355)  sodium chloride 0.9 % bolus 1,000 mL (0 mLs Intravenous Stopped 11/08/16 1212)  piperacillin-tazobactam (ZOSYN) IVPB 3.375 g (0 g Intravenous Stopped 11/08/16 1122)  vancomycin (VANCOCIN) 1,500 mg in sodium chloride 0.9 % 500 mL IVPB (0 mg Intravenous Stopped 11/08/16 1253)  LORazepam (ATIVAN) injection  1 mg (1 mg Intravenous Given 11/08/16 1200)  LORazepam (ATIVAN) injection 1 mg (1 mg Intravenous Given 11/08/16 1351)  LORazepam (ATIVAN) injection 1 mg (1 mg Intravenous Given 11/08/16 1515)     Initial Impression / Assessment and Plan / ED Course  I have reviewed the triage vital signs and the nursing notes.  Pertinent labs & imaging results that were available during my care of the patient were reviewed by me and considered in my medical decision making (see chart for details).     71 year old male with past medical history as above here with confusion, shortness of breath, nausea, and tremor. On arrival, patient is markedly tachycardic and tachypnea. He has diffuse wheezing. He is also tremulous. Initial differential is broad. Primary consideration included COPD exacerbation versus CHF as he has markedly bilateral lower extremity edema and abdominal distention. Based on family's report, most also consider possible alcohol withdrawal and cirrhosis given his abdominal distention and lower extremity edema. Will give Ativan, place on BiPAP for work of breathing, and obtain labs. No fevers or signs of infectious process at this time. DVT/PE is on differential but do not feel CT would be tolerated at this time due to his work of breathing.  Labwork reviewed as above. Patient has lactic acidosis of 3.1. Code sepsis subsequently initiated but will hold on fluids given his markedly bilateral lower extremity edema and absence of any hypotension, as well as suspected component of cirrhosis contribute eating to decrease lactate clearance. He is also DO NOT RESUSCITATE 7 hesitant to fluid overload him. Otherwise, no apparent pneumonia on chest x-ray. His lab work shows elevated LFTs, as well as low albumin consistent with possible alcoholic cirrhosis and he has responded well to Ativan. Will admit to stepdown unit for management of possible COPD exacerbation and alcohol withdrawal, with consideration of PE if  symptoms do not improve.    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

## 2016-11-08 NOTE — ED Triage Notes (Signed)
Patient from home with [facility] for shortness of breath.  Patient states he has had intermittent confusion with shortness of breath and significant lower extremity swelling since Saturday.  Inspiratory wheezing in all fields.  Patient received 125 mg solumedrol, 15mg  albuterol, 0.5mg  atrovent PTA.  18g saline lock in left forearm.  Patient alert and oriented at this time.  Patient wears 4L O2 N/C at baseline.

## 2016-11-09 ENCOUNTER — Observation Stay (HOSPITAL_COMMUNITY): Payer: BLUE CROSS/BLUE SHIELD

## 2016-11-09 ENCOUNTER — Inpatient Hospital Stay (HOSPITAL_COMMUNITY): Payer: BLUE CROSS/BLUE SHIELD

## 2016-11-09 DIAGNOSIS — J9611 Chronic respiratory failure with hypoxia: Secondary | ICD-10-CM | POA: Diagnosis present

## 2016-11-09 DIAGNOSIS — I110 Hypertensive heart disease with heart failure: Secondary | ICD-10-CM | POA: Diagnosis present

## 2016-11-09 DIAGNOSIS — J431 Panlobular emphysema: Secondary | ICD-10-CM | POA: Diagnosis not present

## 2016-11-09 DIAGNOSIS — R600 Localized edema: Secondary | ICD-10-CM | POA: Diagnosis present

## 2016-11-09 DIAGNOSIS — R262 Difficulty in walking, not elsewhere classified: Secondary | ICD-10-CM | POA: Diagnosis present

## 2016-11-09 DIAGNOSIS — K7031 Alcoholic cirrhosis of liver with ascites: Secondary | ICD-10-CM | POA: Diagnosis present

## 2016-11-09 DIAGNOSIS — R6510 Systemic inflammatory response syndrome (SIRS) of non-infectious origin without acute organ dysfunction: Secondary | ICD-10-CM | POA: Diagnosis not present

## 2016-11-09 DIAGNOSIS — R0789 Other chest pain: Secondary | ICD-10-CM | POA: Diagnosis not present

## 2016-11-09 DIAGNOSIS — R410 Disorientation, unspecified: Secondary | ICD-10-CM | POA: Diagnosis present

## 2016-11-09 DIAGNOSIS — R140 Abdominal distension (gaseous): Secondary | ICD-10-CM

## 2016-11-09 DIAGNOSIS — J441 Chronic obstructive pulmonary disease with (acute) exacerbation: Secondary | ICD-10-CM | POA: Diagnosis present

## 2016-11-09 DIAGNOSIS — Z7952 Long term (current) use of systemic steroids: Secondary | ICD-10-CM | POA: Diagnosis not present

## 2016-11-09 DIAGNOSIS — Y900 Blood alcohol level of less than 20 mg/100 ml: Secondary | ICD-10-CM | POA: Diagnosis present

## 2016-11-09 DIAGNOSIS — Z8249 Family history of ischemic heart disease and other diseases of the circulatory system: Secondary | ICD-10-CM | POA: Diagnosis not present

## 2016-11-09 DIAGNOSIS — F10230 Alcohol dependence with withdrawal, uncomplicated: Secondary | ICD-10-CM | POA: Diagnosis present

## 2016-11-09 DIAGNOSIS — Z66 Do not resuscitate: Secondary | ICD-10-CM | POA: Diagnosis present

## 2016-11-09 DIAGNOSIS — K760 Fatty (change of) liver, not elsewhere classified: Secondary | ICD-10-CM | POA: Diagnosis present

## 2016-11-09 DIAGNOSIS — I2510 Atherosclerotic heart disease of native coronary artery without angina pectoris: Secondary | ICD-10-CM | POA: Diagnosis not present

## 2016-11-09 DIAGNOSIS — F17210 Nicotine dependence, cigarettes, uncomplicated: Secondary | ICD-10-CM | POA: Diagnosis present

## 2016-11-09 DIAGNOSIS — E874 Mixed disorder of acid-base balance: Secondary | ICD-10-CM | POA: Diagnosis present

## 2016-11-09 DIAGNOSIS — I5032 Chronic diastolic (congestive) heart failure: Secondary | ICD-10-CM | POA: Diagnosis present

## 2016-11-09 DIAGNOSIS — Z79899 Other long term (current) drug therapy: Secondary | ICD-10-CM | POA: Diagnosis not present

## 2016-11-09 DIAGNOSIS — Z9981 Dependence on supplemental oxygen: Secondary | ICD-10-CM | POA: Diagnosis not present

## 2016-11-09 DIAGNOSIS — F419 Anxiety disorder, unspecified: Secondary | ICD-10-CM | POA: Diagnosis present

## 2016-11-09 HISTORY — DX: Atherosclerotic heart disease of native coronary artery without angina pectoris: I25.10

## 2016-11-09 HISTORY — DX: Other chest pain: R07.89

## 2016-11-09 LAB — URINE CULTURE: Culture: NO GROWTH

## 2016-11-09 LAB — COMPREHENSIVE METABOLIC PANEL WITH GFR
ALT: 85 U/L — ABNORMAL HIGH (ref 17–63)
AST: 76 U/L — ABNORMAL HIGH (ref 15–41)
Albumin: 3.5 g/dL (ref 3.5–5.0)
Alkaline Phosphatase: 57 U/L (ref 38–126)
Anion gap: 8 (ref 5–15)
BUN: 24 mg/dL — ABNORMAL HIGH (ref 6–20)
CO2: 26 mmol/L (ref 22–32)
Calcium: 8.6 mg/dL — ABNORMAL LOW (ref 8.9–10.3)
Chloride: 99 mmol/L — ABNORMAL LOW (ref 101–111)
Creatinine, Ser: 0.94 mg/dL (ref 0.61–1.24)
GFR calc Af Amer: >60 mL/min
GFR calc non Af Amer: >60 mL/min
Glucose, Bld: 191 mg/dL — ABNORMAL HIGH (ref 65–99)
Potassium: 4 mmol/L (ref 3.5–5.1)
Sodium: 133 mmol/L — ABNORMAL LOW (ref 135–145)
Total Bilirubin: 1 mg/dL (ref 0.3–1.2)
Total Protein: 5.4 g/dL — ABNORMAL LOW (ref 6.5–8.1)

## 2016-11-09 LAB — LIPID PANEL
CHOLESTEROL: 219 mg/dL — AB (ref 0–200)
HDL: 131 mg/dL (ref >40)
LDL Cholesterol: 78 mg/dL (ref 0–99)
Total CHOL/HDL Ratio: 1.7 RATIO
Triglycerides: 52 mg/dL (ref <150)
VLDL: 10 mg/dL (ref 0–40)

## 2016-11-09 LAB — CBC
HEMATOCRIT: 29.9 % — AB (ref 39.0–52.0)
Hemoglobin: 10 g/dL — ABNORMAL LOW (ref 13.0–17.0)
MCH: 33.2 pg (ref 26.0–34.0)
MCHC: 33.4 g/dL (ref 30.0–36.0)
MCV: 99.3 fL (ref 78.0–100.0)
Platelets: 136 10*3/uL — ABNORMAL LOW (ref 150–400)
RBC: 3.01 MIL/uL — AB (ref 4.22–5.81)
RDW: 12.5 % (ref 11.5–15.5)
WBC: 8.3 10*3/uL (ref 4.0–10.5)

## 2016-11-09 LAB — TROPONIN I

## 2016-11-09 LAB — TSH: TSH: 1.38 u[IU]/mL (ref 0.350–4.500)

## 2016-11-09 LAB — PROTIME-INR
INR: 1.02
Prothrombin Time: 13.4 seconds (ref 11.4–15.2)

## 2016-11-09 LAB — LACTIC ACID, PLASMA: LACTIC ACID, VENOUS: 0.9 mmol/L (ref 0.5–1.9)

## 2016-11-09 MED ORDER — FOLIC ACID 1 MG PO TABS
1.0000 mg | ORAL_TABLET | Freq: Every day | ORAL | Status: DC
  Administered 2016-11-09 – 2016-11-11 (×3): 1 mg via ORAL
  Filled 2016-11-09 (×4): qty 1

## 2016-11-09 MED ORDER — IPRATROPIUM-ALBUTEROL 0.5-2.5 (3) MG/3ML IN SOLN
3.0000 mL | RESPIRATORY_TRACT | Status: DC
  Administered 2016-11-09 – 2016-11-10 (×4): 3 mL via RESPIRATORY_TRACT
  Filled 2016-11-09 (×6): qty 3

## 2016-11-09 MED ORDER — VITAMIN B-1 100 MG PO TABS
100.0000 mg | ORAL_TABLET | Freq: Every day | ORAL | Status: DC
  Administered 2016-11-09 – 2016-11-11 (×3): 100 mg via ORAL
  Filled 2016-11-09 (×4): qty 1

## 2016-11-09 MED ORDER — ASPIRIN EC 81 MG PO TBEC
81.0000 mg | DELAYED_RELEASE_TABLET | Freq: Every day | ORAL | Status: DC
  Administered 2016-11-09 – 2016-11-11 (×3): 81 mg via ORAL
  Filled 2016-11-09 (×3): qty 1

## 2016-11-09 NOTE — Progress Notes (Addendum)
Received pt from 4N via wheelchair. A&O x4, SOB on exertion. On O2 at 4L by nasal cannula. Expiratory wheezing noted. Family at the bedside. 1745 Pt is mildly anxious, hand tremors visible, Ativan given.

## 2016-11-09 NOTE — Evaluation (Signed)
Occupational Therapy Evaluation Patient Details Name: Gabriel Stafford MRN: [id number] DOB: 09/21/1945 Today's Date: 11/09/2016    History of Present Illness Patient is a 71 y/o male who presents with tachypnea, tachycardia and hypoxia due to multiple factors including COPD exacerbation, acute on chronic CHF and alcohol withdrawal.    Clinical Impression   PTA, pt reports recent increasing difficulty with ADL and functional mobility due to dyspnea on exertion. He lives alone but family reports that they are working on obtaining 24 hour assistance. He currently requires min guard assist for safety with toilet transfers and min assist for LB ADL. He presents with significantly limited activity tolerance for ADL and shortness of breath with any activity. Per pt and family, his home is limited in space for DME. Pt would benefit from continued OT services to improve independence with ADL and maximize safety prior to return home. Plan to progress with energy conservation and DME education next session.     Follow Up Recommendations  No OT follow up;Supervision - Intermittent    Equipment Recommendations  3 in 1 bedside commode;Tub/shower seat    Recommendations for Other Services       Precautions / Restrictions Precautions Precautions: Fall Precaution Comments: watch 02 Restrictions Weight Bearing Restrictions: No      Mobility Bed Mobility               General bed mobility comments: OOB in chair on OT arrival  Transfers Overall transfer level: Needs assistance Equipment used: Rolling walker (2 wheeled) Transfers: Sit to/from Stand Sit to Stand: Min guard         General transfer comment: Min guard for safety with VC's to slow down due to lines.    Balance Overall balance assessment: Needs assistance;History of Falls Sitting-balance support: Feet supported;No upper extremity supported Sitting balance-Leahy Scale: Fair     Standing balance support: During functional  activity;No upper extremity supported Standing balance-Leahy Scale: Fair Standing balance comment: Able to complete toilet transfer with short-distance ambulation without UE support this session.                            ADL either performed or assessed with clinical judgement   ADL Overall ADL's : Needs assistance/impaired Eating/Feeding: Set up;Sitting   Grooming: Set up;Sitting   Upper Body Bathing: Min guard;Sitting   Lower Body Bathing: Minimal assistance;Sit to/from stand   Upper Body Dressing : Min guard;Sitting   Lower Body Dressing: Minimal assistance;Sit to/from stand   Toilet Transfer: Ambulation;Min guard;RW;BSC   Toileting- Water quality scientist and Hygiene: Min guard;Sit to/from stand       Functional mobility during ADLs: Min guard General ADL Comments: Pt with significant shortness of breath with all ADL limiting his ability to complete. Pt fatigues easily and demonstrates decreased activity tolerance for ADL.      Vision   Vision Assessment?: No apparent visual deficits     Perception     Praxis      Pertinent Vitals/Pain Pain Assessment: No/denies pain     Hand Dominance     Extremity/Trunk Assessment Upper Extremity Assessment Upper Extremity Assessment: Generalized weakness   Lower Extremity Assessment Lower Extremity Assessment: Generalized weakness       Communication Communication Communication: No difficulties   Cognition Arousal/Alertness: Awake/alert Behavior During Therapy: WFL for tasks assessed/performed Overall Cognitive Status: Within Functional Limits for tasks assessed  General Comments       Exercises     Shoulder Instructions      Home Living Family/patient expects to be discharged to:: Private residence Living Arrangements: Alone Available Help at Discharge: Family;Available PRN/intermittently Type of Home: House Home Access: Level entry      Home Layout: One level     Bathroom Shower/Tub: Occupational psychologist: Standard     Home Equipment: Cane - single point;Other (comment)   Additional Comments: 02      Prior Functioning/Environment Level of Independence: Needs assistance  Gait / Transfers Assistance Needed: Uses SPC as needed, furniture walker at baseline.  ADL's / Homemaking Assistance Needed: Increased difficulty doing all ADLs, needs more help.    Comments: Sons working on Corporate investment banker supervision for patient. Pt's house is really small, walkers would get in the way. BSC would not fit.        OT Problem List: Decreased strength;Decreased activity tolerance;Impaired balance (sitting and/or standing);Decreased safety awareness;Decreased knowledge of use of DME or AE;Decreased knowledge of precautions;Cardiopulmonary status limiting activity      OT Treatment/Interventions: Self-care/ADL training;Therapeutic exercise;Energy conservation;DME and/or AE instruction;Therapeutic activities;Patient/family education;Balance training    OT Goals(Current goals can be found in the care plan section) Acute Rehab OT Goals Patient Stated Goal: to be able to breathe OT Goal Formulation: With patient/family Time For Goal Achievement: 11/23/16 Potential to Achieve Goals: Good ADL Goals Pt Will Perform Grooming: with modified independence;standing Pt Will Perform Upper Body Dressing: with modified independence;sitting Pt Will Perform Lower Body Dressing: with modified independence;sit to/from stand Pt Will Transfer to Toilet: with modified independence;ambulating;regular height toilet;bedside commode (BSC over toilet) Pt Will Perform Toileting - Clothing Manipulation and hygiene: with modified independence;sit to/from stand Pt Will Perform Tub/Shower Transfer: Shower transfer;ambulating;3 in 1;shower seat;rolling walker Additional ADL Goal #1: Pt will independently generalize 3 strategies to conserve energy during  daily ADL routine.   OT Frequency: Min 2X/week   Barriers to D/C:            Co-evaluation              AM-PAC PT "6 Clicks" Daily Activity     Outcome Measure Help from another person eating meals?: None (set-up) Help from another person taking care of personal grooming?:  (set-up) Help from another person toileting, which includes using toliet, bedpan, or urinal?: A Little Help from another person bathing (including washing, rinsing, drying)?: A Little Help from another person to put on and taking off regular upper body clothing?: A Little Help from another person to put on and taking off regular lower body clothing?: A Little 6 Click Score: 16   End of Session Nurse Communication: Mobility status  Activity Tolerance: Patient limited by fatigue (Also limited by pt transfer to new unit) Patient left: in chair;with nursing/sitter in room;with family/visitor present (in transfer w/c with transport team and RN)  OT Visit Diagnosis: Muscle weakness (generalized) (M62.81);Unsteadiness on feet (R26.81)                Time: 5465-6812 OT Time Calculation (min): 8 min Charges:  OT General Charges $OT Visit: 1 Procedure OT Evaluation $OT Eval Moderate Complexity: 1 Procedure G-Codes: OT G-codes **NOT FOR INPATIENT CLASS** Functional Assessment Tool Used: Clinical judgement Functional Limitation: Self care Self Care Current Status (X5170): At least 1 percent but less than 20 percent impaired, limited or restricted Self Care Goal Status (Y1749): 0 percent impaired, limited or restricted   [employer],  MS OTR/L  Pager: [place] 11/09/2016, 5:27 PM

## 2016-11-09 NOTE — Consult Note (Addendum)
The patient has been seen in conjunction with Vin Bhagat, PAC. All aspects of care have been considered and discussed. The patient has been personally interviewed, examined, and all clinical data has been reviewed.   Chest pain is "like a lump in the chest" but there is no active evidence of ischemia on ECG or cardiac markers.  He does have CAD denoted by coronary calcification seen coincidentally on CT scan.  Recommend treat COPD and infection. At some later date, as OP,  consider and ischemic w/u with nuclear stress testing.  Recommend LDL 70, BP control, aspirin, smoking cessation, and evaluation of glycemic control.  He does not appear to be volume overloaded (other than mild ankle edema) or in acute diastolic heart failure currently.   Cardiology Consultation:   Patient ID: Gabriel Stafford; [id number]; [birth year]   Admit date: 11/08/2016 Date of Consult: 11/09/2016  Primary Care Provider: System, Pcp Not In Primary Cardiologist: New to [facility] ( Dr. Tamala Julian)   Patient Profile:   Gabriel Stafford is a 71 y.o. male with a hx of COPD/Chronic respiratory failure on 4L oxygen 24/7, ongoing tobacco abuse, alcohol abuse, HTN and chronic diastolic CHF who is being seen today for the evaluation of chest pain  at the request of Dr. Andria Frames.   Hx of stem cell therapy trial for COPD at [facility]. Patient denies prior history of MI or stroke. Echocardiogram January 2018 at [facility] showed normal LV function of 50-55%, right ventricular function normal.  He drinks either 2 beer or whiskey every day. Ongoing tobacco smoking for greater than 60 year. Father had MI at age 37 and died of this. Mother had second CABG at age 13 and died of this. Brother has a history of CABG x3.  History of Present Illness:   Gabriel Stafford has intermittent episode of shortness of breath and chest pain since Saturday. He described the pain as sharp achy sensation. Worse with deep breath. He also had some confusion, nausea,  tachycardia and trouble walking. Symptoms lasted for few days and came to ER yesterday for further evaluation. Patient is admitted for sepsis in setting of COPD exacerbation. He is treated with nebulizer and antibiotic.  Troponin negative x 3. Lactic acid 2.66-->0.09.  K4.0. Serum creatinine normal. D-dimer 1.53. TSH normal. CTA of chest without PE. However shows Coronary artery calcification and aortic atherosclerotic calcification. CT of head without acute finding. Urine drug screen clear.  EKG reveals sinus rhythm/tachycardia 100 bpm and is essentially normal without evidence of prior infarction or acute ischemia (personally reviewed Dr. Illene Labrador, III, M.D) .   Past Medical History:  Diagnosis Date  . CHF (congestive heart failure) ([place])   . COPD (chronic obstructive pulmonary disease) ([place])     Past Surgical History:  Procedure Laterality Date  . CYST REMOVAL NECK       Inpatient Medications: Scheduled Meds: . aspirin EC  81 mg Oral Daily  . folic acid  1 mg Oral Daily  . furosemide  40 mg Intravenous BID  . ipratropium-albuterol  3 mL Nebulization Q4H  . losartan  50 mg Oral BH-q7a  . mometasone-formoterol  2 puff Inhalation BID  . sodium chloride flush  3 mL Intravenous Q12H  . thiamine  100 mg Oral Daily  . tiotropium  18 mcg Inhalation Daily   Continuous Infusions: . piperacillin-tazobactam (ZOSYN)  IV 3.375 g (11/09/16 1249)  . vancomycin Stopped (11/09/16 1349)   PRN Meds: albuterol, ibuprofen, LORazepam, ondansetron **OR** ondansetron (ZOFRAN) IV, polyethylene  glycol  Allergies:   No Known Allergies  Social History:   Social History   Social History  . Marital status: Married    Spouse name: N/A  . Number of children: N/A  . Years of education: N/A   Occupational History  . Not on file.   Social History Main Topics  . Smoking status: Current Some Day Smoker    Types: Cigarettes  . Smokeless tobacco: Never Used  . Alcohol use Yes  . Drug  use: No  . Sexual activity: Not Currently   Other Topics Concern  . Not on file   Social History Narrative  . No narrative on file    Family History:   The patient's family history is not on file.  As above noted.   ROS:  Please see the history of present illness.  ROS  All other ROS reviewed and negative.     Physical Exam/Data:   Vitals:   11/09/16 0700 11/09/16 0800 11/09/16 0829 11/09/16 1200  BP:  135/79    Pulse:  (!) 107    Resp:    20  Temp: 98.1 F (36.7 C)   98.2 F (36.8 C)  TempSrc: Oral     SpO2:  98% 98% 98%  Weight:      Height:        Intake/Output Summary (Last 24 hours) at 11/09/16 1513 Last data filed at 11/09/16 1249  Gross per 24 hour  Intake             1030 ml  Output              800 ml  Net              230 ml   Filed Weights   11/08/16 1030 11/08/16 2000  Weight: 194 lb 10.7 oz (88.3 kg) 208 lb 1.6 oz (94.4 kg)   Body mass index is 30.29 kg/m.  General:  Well nourished, well developed, in no acute distress HEENT: normal Lymph: no adenopathy Neck: no JVD Endocrine:  No thryomegaly Vascular: No carotid bruits; FA pulses 2+ bilaterally without bruits  Cardiac:  normal S1, S2; RRR; no murmur  Lungs: Diminished breath sounds throughout with wheezing. Abd: soft, nontender, no hepatomegaly, distended Ext: 1 + BL LE edema Musculoskeletal:  No deformities, BUE and BLE strength normal and equal Skin: warm and dry  Neuro:  CNs 2-12 intact, no focal abnormalities noted Psych:  Normal affect      Relevant CV Studies: Echo 06/29/16 @ [facility] study The left ventricular size is normal. LV ejection fraction = 50-55%.  Left ventricular systolic function is low normal.  Left ventricular filling pattern is impaired. The right ventricle is normal in size and function. The right atrium is mildly dilated. There is no significant valvular stenosis or regurgitation There was insufficient TR detected to calculate RV systolic  pressure. Estimated right atrial pressure is 5 mmHg.Marland Kitchen There is no pericardial effusion. There is no comparison study available. - FINDINGS:  LEFT VENTRICLE The left ventricular size is normal. LV ejection fraction = 50-55%. Left  ventricular systolic function is low normal. Left ventricular filling pattern  is impaired. The left ventricular wall motion is normal. -  RIGHT VENTRICLE The right ventricle is normal in size and function.  LEFT ATRIUM The left atrial size is normal.  RIGHT ATRIUM The right atrium is mildly dilated. Lipomatous hypertrophy of the interatrial  septum is noted. - AORTIC VALVE The aortic valve is  normal in structure and function. There is no aortic  regurgitation. - MITRAL VALVE The mitral valve is normal in structure and function. There is no mitral  regurgitation noted. - TRICUSPID VALVE The tricuspid valve is normal in structure and function. No tricuspid  regurgitation. There was insufficient TR detected to calculate RV systolic  pressure. Estimated right atrial pressure is 5 mmHg.. - PULMONIC VALVE The pulmonic valve is normal in structure and function. There is no pulmonic  valvular regurgitation. - - VENOUS Pulmonary venous flow pattern not well visualized. IVC size was mildly  dilated. _ cm mass visualized in IVC. - EFFUSION There is no pericardial effusion.  Laboratory Data:  Chemistry Recent Labs Lab 11/08/16 0938 11/09/16 0300  NA 138 133*  K 4.2 4.0  CL 100* 99*  CO2 23 26  GLUCOSE 105* 191*  BUN 24* 24*  CREATININE 1.03 0.94  CALCIUM 9.0 8.6*  GFRNONAA >60 >60  GFRAA >60 >60  ANIONGAP 15 8     Recent Labs Lab 11/08/16 0938 11/09/16 0300  PROT 6.6 5.4*  ALBUMIN 4.3 3.5  AST 95* 76*  ALT 98* 85*  ALKPHOS 70 57  BILITOT 1.8* 1.0   Hematology Recent Labs Lab 11/08/16 0938 11/09/16 0300  WBC 7.5 8.3  RBC 3.58* 3.01*  HGB 12.1* 10.0*  HCT 35.5* 29.9*  MCV 99.2 99.3  MCH 33.8 33.2  MCHC 34.1 33.4   RDW 12.6 12.5  PLT 177 136*   Cardiac Enzymes Recent Labs Lab 11/08/16 1448 11/08/16 2007 11/09/16 0300  TROPONINI <0.03 <0.03 <0.03    Recent Labs Lab 11/08/16 1028  TROPIPOC 0.01    BNP Recent Labs Lab 11/08/16 0930  BNP 29.3    DDimer  Recent Labs Lab 11/08/16 1448  DDIMER 1.53*    Radiology/Studies:  Ct Head Wo Contrast  Result Date: 11/08/2016 CLINICAL DATA:  Altered mental status/confusion EXAM: CT HEAD WITHOUT CONTRAST TECHNIQUE: Contiguous axial images were obtained from the base of the skull through the vertex without intravenous contrast. COMPARISON:  None. FINDINGS: Brain: Ventricles and sulci appear within normal limits for age. There is no intracranial mass, hemorrhage, extra-axial fluid collection, or midline shift. Gray-white compartments appear normal. No acute infarct is demonstrable. Vascular: There is no hyperdense vessel. There is no appreciable vascular calcification. Skull: Bony calvarium appears intact. Sinuses/Orbits: Visualized paranasal sinuses are clear. Orbits appear symmetric bilaterally. Other: Mastoid air cells are clear. IMPRESSION: Study within normal limits for age. Electronically Signed   By: Lowella Grip III M.D.   On: 11/08/2016 11:52   Ct Angio Chest Pe W Or Wo Contrast  Result Date: 11/08/2016 CLINICAL DATA:  Congestive heart failure. Respiratory distress. Short of breath and confusion. EXAM: CT ANGIOGRAPHY CHEST WITH CONTRAST TECHNIQUE: Multidetector CT imaging of the chest was performed using the standard protocol during bolus administration of intravenous contrast. Multiplanar CT image reconstructions and MIPs were obtained to evaluate the vascular anatomy. CONTRAST:  80 cc Isovue COMPARISON:  Chest CT 09/27/2016 FINDINGS: Cardiovascular: No filling defects within the pulmonary arteries arteries to suggest acute pulmonary embolism. Coronary artery calcification and aortic atherosclerotic calcification. Mediastinum/Nodes: No axillary  supraclavicular adenopathy. No mediastinal hilar adenopathy. No pericardial fluid. Esophagus normal. Lungs/Pleura: Centrilobular emphysema in the upper lobes. Pleural thickening along the LEFT and RIGHT oblique fissures. No pulmonary infarction. No pulmonary edema, infiltrate, or pneumothorax. Mild linear thickening along the oblique fissures has increased compared to most recent CT Upper Abdomen: Low-attenuation liver may represent hepatic steatosis. Musculoskeletal: No aggressive osseous lesion.  Review of the MIP images confirms the above findings. IMPRESSION: 1. No acute pulmonary embolism. 2. Pleural thickening along the oblique fissures is increased from comparison exam. 3. No pulmonary infarction or pneumonia. 4. Hepatic steatosis Electronically Signed   By: Suzy Bouchard M.D.   On: 11/08/2016 19:12   Dg Chest Portable 1 View  Result Date: 11/08/2016 CLINICAL DATA:  71 year old presenting with acute onset of chest pain and shortness of breath. Current history of COPD, hypertension and CHF. Current smoker. EXAM: PORTABLE CHEST 1 VIEW COMPARISON:  Chest x-ray and CTA chest 09/27/2016. FINDINGS: Cardiac silhouette normal in size for AP technique, unchanged. Thoracic aorta atherosclerotic, unchanged. Hilar and mediastinal contours otherwise unremarkable. Emphysematous changes throughout both lungs as noted previously. Lungs clear. No pneumothorax. Pulmonary vascularity normal. No visible pleural effusions. IMPRESSION: COPD/emphysema.  No acute cardiopulmonary disease. Electronically Signed   By: Evangeline Dakin M.D.   On: 11/08/2016 10:14    Assessment and Plan:   1. Chest pain - Described as a lump in his chest. Possibly due to COPD exacerbation. Patient has ruled out. Troponin negative x 3. EKG without acute ischemic changes. Patient does have a significant risk factors- hypertension, ongoing tobacco abuse and strong family history. - CTA of chest did not showed PE however does coronary  calcification. He will benefit from ischemic evaluation at some point.  -  Needs aggressive risk factor modification, smoking cessation, LDL less than 70, blood pressure control, and evaluation of glycemic control.  2. Sinus tachycardia - Avoid exacerbating medication (COPD nebulizer - albuterol). He has a chronic history of tachycardia. Consider levoalbuterol.   3. Likely diastolic CHF - Echo done at [facility] 06/2016 showed LV function of 50-55% and normal RV size and function. BNP this admission showed 29.3. CXR without vascular congestion.  - ? R sided heart failure due to chronic lung issue. He will benefits for repeat echo.   4. Confusion - Now improved. Likely due to COPD exacerbation and alcohol withdrawal. CT of head without acute funding.   Dispo: Seems his symptoms is likely due to chronic lung issue. He needs to stop smoking. May consider LE doppler for LE edema however its symmetrical. Dr. Tamala Julian to see later today.   Jarrett Soho, PA  11/09/2016 3:13 PM

## 2016-11-09 NOTE — Evaluation (Signed)
Physical Therapy Evaluation Patient Details Name: Gabriel Stafford MRN: [id number] DOB: 1945-10-07 Today's Date: 11/09/2016   History of Present Illness  Patient is a 71 y/o male who presents with tachypnea, tachycardia and hypoxia due to multiple factors including COPD exacerbation, acute on chronic CHF and alcohol withdrawal.   Clinical Impression  Patient presents with dyspnea at rest, worsened with exertion, decreased activity tolerance, impaired cardiovascular endurance and impaired mobility s/p above. Pt limited at home and has increased difficulty performing ADls due to dyspnea. Currently lives alone however son reports they are trying to get him 24/7 supervision and assist. Tolerated ambulating 27' with RW on 4L/min 02 and pt 4/4 DOE and completely wiped after short distance. Problem solved getting the appropriate DME and home setup as well as energy conservation techniques but pt's home not equip for DME. Will follow acutely to maximize independence and mobility prior to return home.    Follow Up Recommendations No PT follow up;Supervision for mobility/OOB;Supervision/Assistance - 24 hour    Equipment Recommendations  None recommended by PT    Recommendations for Other Services       Precautions / Restrictions Precautions Precautions: Fall Precaution Comments: watch 02 Restrictions Weight Bearing Restrictions: No      Mobility  Bed Mobility               General bed mobility comments: Up in chair upon PT arrival.  Transfers Overall transfer level: Needs assistance Equipment used: Rolling walker (2 wheeled) Transfers: Sit to/from Stand Sit to Stand: Min guard         General transfer comment: Min guard for safety. Stood from Youth worker.  Ambulation/Gait Ambulation/Gait assistance: Min assist Ambulation Distance (Feet): 50 Feet Assistive device: Rolling walker (2 wheeled) Gait Pattern/deviations: Step-through pattern;Decreased stride length;Trunk flexed Gait  velocity: decreased   General Gait Details: Slow, unsteady gait with RW for support. Cues for RW management. Sp02 93% on 4L/min 02. 4/4 DOE. 1 standing rest break.  Stairs            Wheelchair Mobility    Modified Rankin (Stroke Patients Only)       Balance Overall balance assessment: Needs assistance;History of Falls Sitting-balance support: Feet supported;No upper extremity supported Sitting balance-Leahy Scale: Fair     Standing balance support: During functional activity;Bilateral upper extremity supported Standing balance-Leahy Scale: Poor Standing balance comment: Reliant on BUEs for support in standing.                             Pertinent Vitals/Pain Pain Assessment: No/denies pain    Home Living Family/patient expects to be discharged to:: Private residence Living Arrangements: Alone Available Help at Discharge: Family;Available PRN/intermittently Type of Home: House Home Access: Level entry     Home Layout: One level Home Equipment: Cane - single point;Other (comment) Additional Comments: 02    Prior Function Level of Independence: Needs assistance   Gait / Transfers Assistance Needed: Uses SPC as needed, furniture walker at baseline.   ADL's / Homemaking Assistance Needed: Increased difficulty doing all ADLs, needs more help.   Comments: Sons working [employer] supervision for patient. Pt's house is really small, walkers would get in the way. BSC would not fit.     Hand Dominance        Extremity/Trunk Assessment   Upper Extremity Assessment Upper Extremity Assessment: Defer to OT evaluation    Lower Extremity Assessment Lower Extremity Assessment: Generalized weakness  Communication   Communication: No difficulties  Cognition Arousal/Alertness: Awake/alert Behavior During Therapy: WFL for tasks assessed/performed Overall Cognitive Status: Within Functional Limits for tasks assessed                                         General Comments General comments (skin integrity, edema, etc.): Son present during session. HR up to 130 bpm during mobility.    Exercises     Assessment/Plan    PT Assessment Patient needs continued PT services  PT Problem List Decreased strength;Decreased mobility;Decreased balance;Cardiopulmonary status limiting activity;Decreased activity tolerance       PT Treatment Interventions Therapeutic activities;Gait training;Therapeutic exercise;Patient/family education;Balance training    PT Goals (Current goals can be found in the Care Plan section)  Acute Rehab PT Goals Patient Stated Goal: to be able to breathe PT Goal Formulation: With patient Time For Goal Achievement: 11/23/16 Potential to Achieve Goals: Fair    Frequency Min 3X/week   Barriers to discharge Decreased caregiver support      Co-evaluation               AM-PAC PT "6 Clicks" Daily Activity  Outcome Measure Difficulty turning over in bed (including adjusting bedclothes, sheets and blankets)?: None Difficulty moving from lying on back to sitting on the side of the bed? : None Difficulty sitting down on and standing up from a chair with arms (e.g., wheelchair, bedside commode, etc,.)?: None Help needed moving to and from a bed to chair (including a wheelchair)?: A Little Help needed walking in hospital room?: A Lot Help needed climbing 3-5 steps with a railing? : A Lot 6 Click Score: 19    End of Session Equipment Utilized During Treatment: Gait belt;Oxygen Activity Tolerance: Patient limited by fatigue;Other (comment) (dyspnea) Patient left: in chair;with family/visitor present;with SCD's reapplied Nurse Communication: Mobility status PT Visit Diagnosis: Unsteadiness on feet (R26.81);Muscle weakness (generalized) (M62.81);Other (comment) (dyspnea)    Time: 4270-6237 PT Time Calculation (min) (ACUTE ONLY): 26 min   Charges:   PT Evaluation $PT Eval Moderate  Complexity: 1 Procedure PT Treatments $Therapeutic Activity: 8-22 mins   PT G Codes:   PT G-Codes **NOT FOR INPATIENT CLASS** Functional Assessment Tool Used: Clinical judgement Functional Limitation: Mobility: Walking and moving around Mobility: Walking and Moving Around Current Status (S2831): At least 20 percent but less than 40 percent impaired, limited or restricted Mobility: Walking and Moving Around Goal Status [phone]): At least 1 percent but less than 20 percent impaired, limited or restricted    [employer], [place], [place] 11/09/2016, 12:31 PM

## 2016-11-09 NOTE — Care Management Obs Status (Signed)
[place] NOTIFICATION   Patient Details  Name: Gabriel Stafford MRN: [id number] Date of Birth: February 27, 1946   Medicare Observation Status Notification Given:  Yes    Carles Collet, RN 11/09/2016, 4:26 PM

## 2016-11-09 NOTE — Progress Notes (Signed)
Family Medicine Teaching Service Daily Progress Note Intern Pager: [phone]  Patient name: Gabriel Stafford Medical record number: [id number] Date of birth: 1946-03-23 Age: 71 y.o. Gender: male  Primary Care Provider: System, Pcp Not In Consultants: None Code Status: DNR/DNI  Assessment and Plan: Gabriel Stafford is a 71 y.o. male with a past medical history significant for presenting with weakness and SOB. PMH is significant for COPD on 4L O2, CHF with EF of 50%, HTN, tobacco abuse and alcohol abuse.  #SIRS without clear source Initially with tachycardia, tachypnea, patient reports AMS at home. No fever. BP stable. WBC normal. Lactic acid trended down to 0.9 this am from 3.18 on admission . CT head negative. CXR negative for any acute process. UA was not concerning. No clear source of infection. Lactic acidosis could be related to liver dysfunction from alcohol abuse. Will further assess possible  --Continue vancomycin 1g bid --Continue zosyn 3.375g tid --Follow up on urine and blood cultures --PT/OT consult for global weakness  #COPD with acute exacerbation Patient is on 4L O2 at baseline. Briefly required BiPaP. On admission, ABG 7.486, O2 50, CO2 37.5, Bicarb 28, consistent with respiratory alkalosis. CXR was within normal limit. Takes arnuity ellipta and stiolto respimat. Received atrovent, mag, and 125 mg of solumedrol in ED. --Continue Vancomycin 1g bid --Continue Zosyn 3.375g tid --Continue Duonebs q4 scheduled --Continue Albuterol nebs q4 hours as needed --Continue Dulera 2 puff bid --Continue Spiriva 18 mcg   --Continue prednisone 50 mg daily DAY 1  #Bilateral lower extremities edema Etiology unclear could be of cardiac etiology with his history of HFrEF but negative trops, normal BNP and recent ECHO with an EF of 50%. Swelling could be secondary to kidney dysfunction, but unlikely given normal kidney function. Most likely etiology is hepatic with what appears to be distended  abdomen secondary to ascites. Patient with extensive history of alcohol abuse, transaminitis on admission, hepatic steatosis possible liver cirrhosis. --Follow on complete US of the abdomen --Start patient on Furosemide 40 mg bid  #Alcohol abuse CIWA scores overnight 5>11. Patient received ativan x2 since admission. Per patient, drinks 2 beers or 2 whisky drinks a day. Per ED provider has a more extensive hx of alcohol use as told to provider by patient's friend. Alcohol level was 9 opn admission though patient reported last drink was several days. --Will continue CIWA protocol --Thiamine and Folic acid  #HFrEF Last Echo January with EF of 50% with right atrial pressure of 5 mmHg with mild dilation, currently with lower extremity edema past knees. No crackles auscultated. BNP normal at 29.3. Received over a liter of fluids in ED per sepsis protocol. Takes 40mg  Lasix BID and also had 40 mg BID of torsemide on home med list. Reports a 8-10 pound weight gain recently. EKG reports a fib but p waves present.  --Continue Furosemide 40 mg bid --Start patient on aspirin 81 mg  --Follow up on lipid panel --Daily weights --Strict I&Os  #HTN Patient is normotensive this morning. --Will continue losartan 50 mg   FEN/GI: Heart Healthy diet  PPx: Lovenox  Disposition: Pending clinical work up and cardiac and GI work up  Subjective:  Patient is still complaining of SOB and some anxiety related to his breathing otherwise stable.   Objective: Temp:  [98.1 F (36.7 C)-99.8 F (37.7 C)] 98.4 F (36.9 C) (06/06 0300) Pulse Rate:  [90-132] 90 (06/06 0400) Resp:  [13-28] 19 (06/06 0400) BP: (81-145)/(55-95) 109/75 (06/06 0400) SpO2:  [94 %-100 %] 100 % (  06/06 0400) FiO2 (%):  [40 %] 40 % (06/05 1013) Weight:  [194 lb 10.7 oz (88.3 kg)-208 lb 1.6 oz (94.4 kg)] 208 lb 1.6 oz (94.4 kg) (06/05 2000)   Physical Exam: General: elderly man in NAD,  in place Eyes: PERRL, EOMI, no scleral icterus or  conjunctival injection ENTM: dry mucous membranes, no erythema or exudate in oropharynx Neck: supple, non-tender, no LAD Cardiovascular: tachycardic, no MRG Respiratory: Decreased air movement bilaterally. Expiratory wheezes with prolonged expiratory phase.  Gastrointestinal: soft. Distended abdomen. Non-tender. No fluid wave. +BS. Tympanic.  MSK: +2 pitting edema up to knees bilaterally.  Derm: dry, no rashes or lesions noted Neuro: AOx3, CN2-12 intact, strength 5/5 in upper and lower extremities bilaterally, sensation intact throughout. Tremor present in upper extremities bilaterally. No asterixis.  Psych: appropriate mood and affect  Laboratory:  Recent Labs Lab 11/08/16 0938 11/09/16 0300  WBC 7.5 8.3  HGB 12.1* 10.0*  HCT 35.5* 29.9*  PLT 177 136*    Recent Labs Lab 11/08/16 0938 11/09/16 0300  NA 138 133*  K 4.2 4.0  CL 100* 99*  CO2 23 26  BUN 24* 24*  CREATININE 1.03 0.94  CALCIUM 9.0 8.6*  PROT 6.6 5.4*  BILITOT 1.8* 1.0  ALKPHOS 70 57  ALT 98* 85*  AST 95* 76*  GLUCOSE 105* 191*   Troponin (Point of Care Test)  Recent Labs  11/08/16 1028  TROPIPOC 0.01   BNP    Component Value Date/Time   BNP 29.3 11/08/2016 0930    Imaging/Diagnostic Tests: Ct Head Wo Contrast  Result Date: 11/08/2016 CLINICAL DATA:  Altered mental status/confusion EXAM: CT HEAD WITHOUT CONTRAST TECHNIQUE: Contiguous axial images were obtained from the base of the skull through the vertex without intravenous contrast. COMPARISON:  None. FINDINGS: Brain: Ventricles and sulci appear within normal limits for age. There is no intracranial mass, hemorrhage, extra-axial fluid collection, or midline shift. Gray-white compartments appear normal. No acute infarct is demonstrable. Vascular: There is no hyperdense vessel. There is no appreciable vascular calcification. Skull: Bony calvarium appears intact. Sinuses/Orbits: Visualized paranasal sinuses are clear. Orbits appear symmetric  bilaterally. Other: Mastoid air cells are clear. IMPRESSION: Study within normal limits for age. Electronically Signed   By: Lowella Grip III M.D.   On: 11/08/2016 11:52   Ct Angio Chest Pe W Or Wo Contrast  Result Date: 11/08/2016 CLINICAL DATA:  Congestive heart failure. Respiratory distress. Short of breath and confusion. EXAM: CT ANGIOGRAPHY CHEST WITH CONTRAST TECHNIQUE: Multidetector CT imaging of the chest was performed using the standard protocol during bolus administration of intravenous contrast. Multiplanar CT image reconstructions and MIPs were obtained to evaluate the vascular anatomy. CONTRAST:  80 cc Isovue COMPARISON:  Chest CT 09/27/2016 FINDINGS: Cardiovascular: No filling defects within the pulmonary arteries arteries to suggest acute pulmonary embolism. Coronary artery calcification and aortic atherosclerotic calcification. Mediastinum/Nodes: No axillary supraclavicular adenopathy. No mediastinal hilar adenopathy. No pericardial fluid. Esophagus normal. Lungs/Pleura: Centrilobular emphysema in the upper lobes. Pleural thickening along the LEFT and RIGHT oblique fissures. No pulmonary infarction. No pulmonary edema, infiltrate, or pneumothorax. Mild linear thickening along the oblique fissures has increased compared to most recent CT Upper Abdomen: Low-attenuation liver may represent hepatic steatosis. Musculoskeletal: No aggressive osseous lesion. Review of the MIP images confirms the above findings. IMPRESSION: 1. No acute pulmonary embolism. 2. Pleural thickening along the oblique fissures is increased from comparison exam. 3. No pulmonary infarction or pneumonia. 4. Hepatic steatosis Electronically Signed   By: Suzy Bouchard  M.D.   On: 11/08/2016 19:12   Dg Chest Portable 1 View  Result Date: 11/08/2016 CLINICAL DATA:  71 year old presenting with acute onset of chest pain and shortness of breath. Current history of COPD, hypertension and CHF. Current smoker. EXAM: PORTABLE CHEST  1 VIEW COMPARISON:  Chest x-ray and CTA chest 09/27/2016. FINDINGS: Cardiac silhouette normal in size for AP technique, unchanged. Thoracic aorta atherosclerotic, unchanged. Hilar and mediastinal contours otherwise unremarkable. Emphysematous changes throughout both lungs as noted previously. Lungs clear. No pneumothorax. Pulmonary vascularity normal. No visible pleural effusions. IMPRESSION: COPD/emphysema.  No acute cardiopulmonary disease. Electronically Signed   By: Evangeline Dakin M.D.   On: 11/08/2016 10:14     Marjie Skiff, MD 11/09/2016, 7:31 AM PGY-1, [facility] Intern pager: [phone], text pages welcome

## 2016-11-10 DIAGNOSIS — I2510 Atherosclerotic heart disease of native coronary artery without angina pectoris: Secondary | ICD-10-CM

## 2016-11-10 DIAGNOSIS — R0789 Other chest pain: Secondary | ICD-10-CM

## 2016-11-10 LAB — CBC
HCT: 30 % — ABNORMAL LOW (ref 39.0–52.0)
HEMOGLOBIN: 9.8 g/dL — AB (ref 13.0–17.0)
MCH: 33.6 pg (ref 26.0–34.0)
MCHC: 32.7 g/dL (ref 30.0–36.0)
MCV: 102.7 fL — ABNORMAL HIGH (ref 78.0–100.0)
PLATELETS: 135 10*3/uL — AB (ref 150–400)
RBC: 2.92 MIL/uL — ABNORMAL LOW (ref 4.22–5.81)
RDW: 12.9 % (ref 11.5–15.5)
WBC: 8.4 10*3/uL (ref 4.0–10.5)

## 2016-11-10 LAB — BASIC METABOLIC PANEL
ANION GAP: 10 (ref 5–15)
BUN: 24 mg/dL — ABNORMAL HIGH (ref 6–20)
CALCIUM: 8.5 mg/dL — AB (ref 8.9–10.3)
CO2: 29 mmol/L (ref 22–32)
Chloride: 100 mmol/L — ABNORMAL LOW (ref 101–111)
Creatinine, Ser: 1 mg/dL (ref 0.61–1.24)
GFR calc Af Amer: >60 mL/min (ref >60)
GLUCOSE: 94 mg/dL (ref 65–99)
Potassium: 3.6 mmol/L (ref 3.5–5.1)
Sodium: 139 mmol/L (ref 135–145)

## 2016-11-10 LAB — VITAMIN B12: Vitamin B-12: 559 pg/mL (ref 180–914)

## 2016-11-10 LAB — FOLATE: FOLATE: 43.5 ng/mL (ref >5.9)

## 2016-11-10 MED ORDER — ALBUTEROL SULFATE (2.5 MG/3ML) 0.083% IN NEBU
2.5000 mg | INHALATION_SOLUTION | Freq: Four times a day (QID) | RESPIRATORY_TRACT | Status: DC
  Administered 2016-11-10 – 2016-11-11 (×5): 2.5 mg via RESPIRATORY_TRACT
  Filled 2016-11-10 (×7): qty 3

## 2016-11-10 MED ORDER — ALBUTEROL SULFATE (2.5 MG/3ML) 0.083% IN NEBU: 2.5000 mg | INHALATION_SOLUTION | RESPIRATORY_TRACT | Status: DC | PRN

## 2016-11-10 MED ORDER — LEVOFLOXACIN 500 MG PO TABS
500.0000 mg | ORAL_TABLET | Freq: Every day | ORAL | Status: DC
  Administered 2016-11-10 – 2016-11-11 (×2): 500 mg via ORAL
  Filled 2016-11-10 (×2): qty 1

## 2016-11-10 MED ORDER — GADOBENATE DIMEGLUMINE 529 MG/ML IV SOLN: 20.0000 mL | Freq: Once | INTRAVENOUS | Status: DC | PRN

## 2016-11-10 NOTE — Progress Notes (Signed)
Family Medicine Teaching Service Daily Progress Note Intern Pager: [phone]  Patient name: Gabriel Stafford Medical record number: [id number] Date of birth: 1946/03/30 Age: 71 y.o. Gender: male  Primary Care Provider: System, Pcp Not In Consultants: None Code Status: DNR/DNI  Assessment and Plan: Gabriel Stafford is a 71 y.o. male with a past medical history significant for presenting with weakness and SOB. PMH is significant for COPD on 4L O2, CHF with EF of 50%, HTN, tobacco abuse and alcohol abuse.  #SIRS without clear source Tachycardia, tachypnea have resolved. Patient has been afebrile and lactic acid is within normal limit. Blood and urine cultures showed no growth to date. --Will start Levaquin 500 mg daily Day 1 will complete a total 7 day course --Discontinue vancomycin 1g bid --Discontinue zosyn 3.375g tid --Follow up on urine and blood cultures --PT/OT consult for global weakness  #COPD, acute exacerbation, improving Patient is still on 4L O2 (Baseline). CXR was within normal limit. Patient still has moderate wheezing on pulmonary exam. --Continue Duonebs q4 scheduled --Continue Albuterol nebs q4 hours as needed --Continue Dulera 2 puff bid --Continue Spiriva 18 mcg   --Continue prednisone 50 mg daily DAY 2  #Bilateral lower extremities edema Patient on furosemide at home, recently had a decrease on home regimen. Currently 40 mg bid daily at home. Patient seem to think that since since his dosage was decreased he has been accumulating fluid in his abdomen and lower extremities. Abdominal US was negative for ascites and showed coarse echogenic liver suggesting fatty infiltration.  --Continue Furosemide 40 mg bid  #Altered Mental Status, resolved Patient reported some difficulty with speech prior to admission concerning for stroke. Brain MRI was normal. Low concern at the moment. Will consider Neurology follow up as needed.  #Alcohol abuse CIWA scores overnight have been  low 1>4>1 . Patient received ativan x2 since admission. --Will continue CIWA protocol --Thiamine and Folic Acid  #HFrEF Last Echo January with EF of 50%. BNP normal 29.3. Takes 40 mg Lasix BID at home. Patient was seen by cardiology yesterday after reporting chest pressure prior to admission. Cardiology recommended outpatient stress test but no concerning findings on EKG. Lipid panel showed cholesterol 219, HDL 131 and LDL 78 consistent with long standing alcohol use. --Continue Furosemide 40 mg bid --Continue aspirin 81 mg  --Continue atorvastatin 40 mg  --Daily weights --Strict I&Os  #HTN Patient is normotensive this morning. --Will continue losartan 50 mg   FEN/GI: Heart Healthy diet  PPx: Lovenox  Disposition: Pending clinical work up and cardiac and GI work up  Subjective:  Patient has mild improvement in respiratory status overnight. Patient is taking good po and ambulating.  Objective: Temp:  [98.2 F (36.8 C)-98.7 F (37.1 C)] 98.5 F (36.9 C) (06/07 0556) Pulse Rate:  [98-109] 98 (06/07 0556) Resp:  [20] 20 (06/07 0556) BP: (104-143)/(69-87) 104/87 (06/07 0556) SpO2:  [97 %-99 %] 97 % (06/07 0850)   Physical Exam:  General: elderly man in NAD, [place] in place Eyes: PERRL, EOMI, no scleral icterus or conjunctival injection ENTM: dry mucous membranes, no erythema or exudate in oropharynx Neck: supple, non-tender, no LAD Cardiovascular: tachycardic, no MRG Respiratory: Expiratory wheezes with prolonged expiratory phase no increase work of breathing. Gastrointestinal: soft. Distended abdomen. Non-tender. No fluid wave. +BS. Tympanic.  MSK: +2 pitting edema up to knees bilaterally.  Derm: dry, no rashes or lesions noted Neuro: AOx3, CN2-12 intact, strength 5/5 in upper and lower extremities bilaterally, sensation intact throughout. Tremor present in upper extremities bilaterally.  No asterixis.  Psych: appropriate mood and affect  Laboratory:  Recent Labs Lab  11/08/16 0938 11/09/16 0300 11/10/16 0404  WBC 7.5 8.3 8.4  HGB 12.1* 10.0* 9.8*  HCT 35.5* 29.9* 30.0*  PLT 177 136* 135*    Recent Labs Lab 11/08/16 0938 11/09/16 0300 11/10/16 0404  NA 138 133* 139  K 4.2 4.0 3.6  CL 100* 99* 100*  CO2 23 26 29   BUN 24* 24* 24*  CREATININE 1.03 0.94 1.00  CALCIUM 9.0 8.6* 8.5*  PROT 6.6 5.4*  --   BILITOT 1.8* 1.0  --   ALKPHOS 70 57  --   ALT 98* 85*  --   AST 95* 76*  --   GLUCOSE 105* 191* 94   Troponin (Point of Care Test)  Recent Labs  11/08/16 1028  TROPIPOC 0.01   BNP    Component Value Date/Time   BNP 29.3 11/08/2016 0930    Imaging/Diagnostic Tests: Ct Angio Chest Pe W Or Wo Contrast  Result Date: 11/08/2016 CLINICAL DATA:  Congestive heart failure. Respiratory distress. Short of breath and confusion. EXAM: CT ANGIOGRAPHY CHEST WITH CONTRAST TECHNIQUE: Multidetector CT imaging of the chest was performed using the standard protocol during bolus administration of intravenous contrast. Multiplanar CT image reconstructions and MIPs were obtained to evaluate the vascular anatomy. CONTRAST:  80 cc Isovue COMPARISON:  Chest CT 09/27/2016 FINDINGS: Cardiovascular: No filling defects within the pulmonary arteries arteries to suggest acute pulmonary embolism. Coronary artery calcification and aortic atherosclerotic calcification. Mediastinum/Nodes: No axillary supraclavicular adenopathy. No mediastinal hilar adenopathy. No pericardial fluid. Esophagus normal. Lungs/Pleura: Centrilobular emphysema in the upper lobes. Pleural thickening along the LEFT and RIGHT oblique fissures. No pulmonary infarction. No pulmonary edema, infiltrate, or pneumothorax. Mild linear thickening along the oblique fissures has increased compared to most recent CT Upper Abdomen: Low-attenuation liver may represent hepatic steatosis. Musculoskeletal: No aggressive osseous lesion. Review of the MIP images confirms the above findings. IMPRESSION: 1. No acute  pulmonary embolism. 2. Pleural thickening along the oblique fissures is increased from comparison exam. 3. No pulmonary infarction or pneumonia. 4. Hepatic steatosis Electronically Signed   By: Suzy Bouchard M.D.   On: 11/08/2016 19:12   Mr Jeri Cos HC Contrast  Result Date: 11/10/2016 CLINICAL DATA:  Initial evaluation for acute aphasia. EXAM: MRI HEAD WITHOUT AND WITH CONTRAST TECHNIQUE: Multiplanar, multiecho pulse sequences of the brain and surrounding structures were obtained without and with intravenous contrast. CONTRAST:  20 cc of MultiHance. COMPARISON:  Prior CT from 11/08/2016. FINDINGS: Brain: Generalized age-related cerebral atrophy. Minimal patchy T2/FLAIR hyperintensity within the periventricular and deep white matter both cerebral hemispheres as well as within the pons. Changes are nonspecific, but most like related to chronic small vessel ischemic disease, and felt to be within normal limits for age. No abnormal foci of restricted diffusion to suggest acute or subacute ischemia. Gray-white matter differentiation maintained. No areas of chronic infarction identified. No evidence for acute or chronic intracranial hemorrhage. No mass lesion, midline shift or mass effect. Ventricles normal size without evidence for hydrocephalus. No extra-axial fluid collection. Major dural sinuses are grossly patent. No abnormal enhancement. Pituitary gland within normal limits. Midline structures intact and normal. Vascular: Major intracranial vascular flow voids are well maintained. Skull and upper cervical spine: Craniocervical junction within normal limits. Mild degenerative thickening noted at the tectorial membrane without significant stenosis. Remainder the visualized upper cervical spine otherwise unremarkable. Bone marrow signal intensity within normal limits. No scalp soft tissue abnormality. Sinuses/Orbits: Globes  and orbital soft tissues within normal limits. Mild scattered mucosal thickening within  the ethmoidal air cells and maxillary sinuses. Paranasal sinuses are otherwise clear. No mastoid effusion. Inner ear structures normal. Other: None. IMPRESSION: Normal brain MRI for age.  No acute intracranial process identified. Electronically Signed   By: Jeannine Boga M.D.   On: 11/10/2016 03:27   US Abdomen Complete  Result Date: 11/09/2016 CLINICAL DATA:  Abdominal distension with elevated LFTs EXAM: ABDOMEN ULTRASOUND COMPLETE COMPARISON:  None. FINDINGS: Gallbladder: No gallstones or wall thickening visualized. No sonographic Murphy sign noted by sonographer. Common bile duct: Diameter: Normal at 3.7 mm Liver: Coarse heterogenous increased echogenicity. No focal hepatic abnormality. IVC: No abnormality visualized. Pancreas: Poorly visualized due to bowel gas. Spleen: Size and appearance within normal limits. Right Kidney: Length: 12.9 cm. Echogenicity within normal limits. No hydronephrosis. Cyst in the lower pole measuring 2.7 x 2.1 x 2.5 cm Left Kidney: Length: 12.5 cm. Echogenicity within normal limits. No mass or hydronephrosis visualized. Abdominal aorta: Ectatic proximal segment up to 3 cm. Mid to distal aorta obscured by bowel gas. Other findings: None. IMPRESSION: 1. Coarse echogenic liver suggesting fatty infiltration. No gallstones or biliary dilatation 2. 2.7 cm simple cyst in the right kidney 3. Abdominal aorta measures 3 cm in maximum diameter proximally. Recommend followup by ultrasound in 3 years. This recommendation follows ACR consensus guidelines: White Paper of the ACR Incidental Findings Committee II on Vascular Findings. Natasha Mead Coll Radiol 2013; 10:789-794 Electronically Signed   By: Donavan Foil M.D.   On: 11/09/2016 15:58    Marjie Skiff, MD 11/10/2016, 12:05 PM PGY-1, [facility] Intern pager: [phone], text pages welcome

## 2016-11-10 NOTE — Care Management Note (Addendum)
Case Management Note  Patient Details  Name: Gabriel Stafford MRN: [id number] Date of Birth: 06/26/1945  Subjective/Objective:                    Action/Plan:  Confirmed face sheet information with patient. Patient lives alone , reports he has multiple friends who help him and check on him. His son is working on arranging 24 hour assistance at home. Provided private duty sitter list.   He has home oxygen through [facility] in [phone]. Patient has portable tank at bedside and states a friend plans on driving him home at discharge.   Patient has had [facility] in the past for [facility] for disease management and would like same again. Paged Family Medicine pager and requested order and face to face. Santiago Glad with [facility] aware also awaiting order.  In past patient was referral to hospice . At present patient states he is not active with a hospice agency and at this time wants home health.    PT requesting HHPT . Paged Family Medicine pager for order. Expected Discharge Date:                  Expected Discharge Plan:  [facility]  In-House Referral:     Discharge planning Services  CM Consult  Post Acute Care Choice:  Home Health Choice offered to:  Patient  DME Arranged:    DME Agency:     HH Arranged:    [place] Agency:     Status of Service:  In process, will continue to follow  If discussed at Long Length of Stay Meetings, dates discussed:    Additional Comments:  Marilu Favre, RN 11/10/2016, 10:30 AM

## 2016-11-10 NOTE — Progress Notes (Signed)
Progress Note  Patient Name: Cejay Cambre Date of Encounter: 11/10/2016  Primary Cardiologist: Reola Calkins Tamala Julian)  Subjective   Sleeping until I awakened him. Breathing seems markedly improved compared to yesterday although during exam yesterday he had just gotten back in bed from a trip to the bathroom and was severely short of breath. He denies chest pain.  Inpatient Medications    Scheduled Meds: . aspirin EC  81 mg Oral Daily  . folic acid  1 mg Oral Daily  . furosemide  40 mg Intravenous BID  . ipratropium-albuterol  3 mL Nebulization Q4H  . losartan  50 mg Oral BH-q7a  . mometasone-formoterol  2 puff Inhalation BID  . sodium chloride flush  3 mL Intravenous Q12H  . thiamine  100 mg Oral Daily  . tiotropium  18 mcg Inhalation Daily   Continuous Infusions: . piperacillin-tazobactam (ZOSYN)  IV 3.375 g (11/10/16 0503)  . vancomycin Stopped (11/10/16 0222)   PRN Meds: albuterol, gadobenate dimeglumine, ibuprofen, LORazepam, ondansetron **OR** ondansetron (ZOFRAN) IV, polyethylene glycol   Vital Signs    Vitals:   11/10/16 0123 11/10/16 0410 11/10/16 0556 11/10/16 0850  BP: (!) 143/77  104/87   Pulse: (!) 109  98   Resp: 20  20   Temp: 98.7 F (37.1 C)  98.5 F (36.9 C)   TempSrc: Oral  Oral   SpO2: 98% 98% 97% 97%  Weight:      Height:        Intake/Output Summary (Last 24 hours) at 11/10/16 0936 Last data filed at 11/10/16 0815  Gross per 24 hour  Intake             1160 ml  Output             1000 ml  Net              160 ml   Filed Weights   11/08/16 1030 11/08/16 2000  Weight: 194 lb 10.7 oz (88.3 kg) 208 lb 1.6 oz (94.4 kg)    Telemetry    Sinus tachycardia with PACs and occasional nonsustained SVT. - Personally Reviewed  ECG    No new - Personally Reviewed  Physical Exam  Appears calm with pink skin. GEN: No acute distress.   Neck: No JVD Cardiac: RRR, no murmurs, rubs, or gallops.  Respiratory:  faint expiratory wheezes with basilar  crackles.Marland Kitchen GI: Soft, nontender, non-distended  MS: No edema; No deformity. Neuro:  Nonfocal  Psych: Normal affect   Labs    Chemistry Recent Labs Lab 11/08/16 0938 11/09/16 0300 11/10/16 0404  NA 138 133* 139  K 4.2 4.0 3.6  CL 100* 99* 100*  CO2 23 26 29   GLUCOSE 105* 191* 94  BUN 24* 24* 24*  CREATININE 1.03 0.94 1.00  CALCIUM 9.0 8.6* 8.5*  PROT 6.6 5.4*  --   ALBUMIN 4.3 3.5  --   AST 95* 76*  --   ALT 98* 85*  --   ALKPHOS 70 57  --   BILITOT 1.8* 1.0  --   GFRNONAA >60 >60 >60  GFRAA >60 >60 >60  ANIONGAP 15 8 10      Hematology Recent Labs Lab 11/08/16 0938 11/09/16 0300 11/10/16 0404  WBC 7.5 8.3 8.4  RBC 3.58* 3.01* 2.92*  HGB 12.1* 10.0* 9.8*  HCT 35.5* 29.9* 30.0*  MCV 99.2 99.3 102.7*  MCH 33.8 33.2 33.6  MCHC 34.1 33.4 32.7  RDW 12.6 12.5 12.9  PLT 177 136* 135*  Cardiac Enzymes Recent Labs Lab 11/08/16 1448 11/08/16 2007 11/09/16 0300  TROPONINI <0.03 <0.03 <0.03    Recent Labs Lab 11/08/16 1028  TROPIPOC 0.01     BNP Recent Labs Lab 11/08/16 0930  BNP 29.3     DDimer  Recent Labs Lab 11/08/16 1448  DDIMER 1.53*     Radiology    Ct Head Wo Contrast  Result Date: 11/08/2016 CLINICAL DATA:  Altered mental status/confusion EXAM: CT HEAD WITHOUT CONTRAST TECHNIQUE: Contiguous axial images were obtained from the base of the skull through the vertex without intravenous contrast. COMPARISON:  None. FINDINGS: Brain: Ventricles and sulci appear within normal limits for age. There is no intracranial mass, hemorrhage, extra-axial fluid collection, or midline shift. Gray-white compartments appear normal. No acute infarct is demonstrable. Vascular: There is no hyperdense vessel. There is no appreciable vascular calcification. Skull: Bony calvarium appears intact. Sinuses/Orbits: Visualized paranasal sinuses are clear. Orbits appear symmetric bilaterally. Other: Mastoid air cells are clear. IMPRESSION: Study within normal limits for  age. Electronically Signed   By: Lowella Grip III M.D.   On: 11/08/2016 11:52   Ct Angio Chest Pe W Or Wo Contrast  Result Date: 11/08/2016 CLINICAL DATA:  Congestive heart failure. Respiratory distress. Short of breath and confusion. EXAM: CT ANGIOGRAPHY CHEST WITH CONTRAST TECHNIQUE: Multidetector CT imaging of the chest was performed using the standard protocol during bolus administration of intravenous contrast. Multiplanar CT image reconstructions and MIPs were obtained to evaluate the vascular anatomy. CONTRAST:  80 cc Isovue COMPARISON:  Chest CT 09/27/2016 FINDINGS: Cardiovascular: No filling defects within the pulmonary arteries arteries to suggest acute pulmonary embolism. Coronary artery calcification and aortic atherosclerotic calcification. Mediastinum/Nodes: No axillary supraclavicular adenopathy. No mediastinal hilar adenopathy. No pericardial fluid. Esophagus normal. Lungs/Pleura: Centrilobular emphysema in the upper lobes. Pleural thickening along the LEFT and RIGHT oblique fissures. No pulmonary infarction. No pulmonary edema, infiltrate, or pneumothorax. Mild linear thickening along the oblique fissures has increased compared to most recent CT Upper Abdomen: Low-attenuation liver may represent hepatic steatosis. Musculoskeletal: No aggressive osseous lesion. Review of the MIP images confirms the above findings. IMPRESSION: 1. No acute pulmonary embolism. 2. Pleural thickening along the oblique fissures is increased from comparison exam. 3. No pulmonary infarction or pneumonia. 4. Hepatic steatosis Electronically Signed   By: Suzy Bouchard M.D.   On: 11/08/2016 19:12   Mr Jeri Cos KG Contrast  Result Date: 11/10/2016 CLINICAL DATA:  Initial evaluation for acute aphasia. EXAM: MRI HEAD WITHOUT AND WITH CONTRAST TECHNIQUE: Multiplanar, multiecho pulse sequences of the brain and surrounding structures were obtained without and with intravenous contrast. CONTRAST:  20 cc of MultiHance.  COMPARISON:  Prior CT from 11/08/2016. FINDINGS: Brain: Generalized age-related cerebral atrophy. Minimal patchy T2/FLAIR hyperintensity within the periventricular and deep white matter both cerebral hemispheres as well as within the pons. Changes are nonspecific, but most like related to chronic small vessel ischemic disease, and felt to be within normal limits for age. No abnormal foci of restricted diffusion to suggest acute or subacute ischemia. Gray-white matter differentiation maintained. No areas of chronic infarction identified. No evidence for acute or chronic intracranial hemorrhage. No mass lesion, midline shift or mass effect. Ventricles normal size without evidence for hydrocephalus. No extra-axial fluid collection. Major dural sinuses are grossly patent. No abnormal enhancement. Pituitary gland within normal limits. Midline structures intact and normal. Vascular: Major intracranial vascular flow voids are well maintained. Skull and upper cervical spine: Craniocervical junction within normal limits. Mild degenerative thickening noted at  the tectorial membrane without significant stenosis. Remainder the visualized upper cervical spine otherwise unremarkable. Bone marrow signal intensity within normal limits. No scalp soft tissue abnormality. Sinuses/Orbits: Globes and orbital soft tissues within normal limits. Mild scattered mucosal thickening within the ethmoidal air cells and maxillary sinuses. Paranasal sinuses are otherwise clear. No mastoid effusion. Inner ear structures normal. Other: None. IMPRESSION: Normal brain MRI for age.  No acute intracranial process identified. Electronically Signed   By: Jeannine Boga M.D.   On: 11/10/2016 03:27   US Abdomen Complete  Result Date: 11/09/2016 CLINICAL DATA:  Abdominal distension with elevated LFTs EXAM: ABDOMEN ULTRASOUND COMPLETE COMPARISON:  None. FINDINGS: Gallbladder: No gallstones or wall thickening visualized. No sonographic Murphy sign  noted by sonographer. Common bile duct: Diameter: Normal at 3.7 mm Liver: Coarse heterogenous increased echogenicity. No focal hepatic abnormality. IVC: No abnormality visualized. Pancreas: Poorly visualized due to bowel gas. Spleen: Size and appearance within normal limits. Right Kidney: Length: 12.9 cm. Echogenicity within normal limits. No hydronephrosis. Cyst in the lower pole measuring 2.7 x 2.1 x 2.5 cm Left Kidney: Length: 12.5 cm. Echogenicity within normal limits. No mass or hydronephrosis visualized. Abdominal aorta: Ectatic proximal segment up to 3 cm. Mid to distal aorta obscured by bowel gas. Other findings: None. IMPRESSION: 1. Coarse echogenic liver suggesting fatty infiltration. No gallstones or biliary dilatation 2. 2.7 cm simple cyst in the right kidney 3. Abdominal aorta measures 3 cm in maximum diameter proximally. Recommend followup by ultrasound in 3 years. This recommendation follows ACR consensus guidelines: White Paper of the ACR Incidental Findings Committee II on Vascular Findings. Nat[facility] Coll Radiol 2013; 10:789-794 Electronically Signed   By: Donavan Foil M.D.   On: 11/09/2016 15:58   Dg Chest Portable 1 View  Result Date: 11/08/2016 CLINICAL DATA:  71 year old presenting with acute onset of chest pain and shortness of breath. Current history of COPD, hypertension and CHF. Current smoker. EXAM: PORTABLE CHEST 1 VIEW COMPARISON:  Chest x-ray and CTA chest 09/27/2016. FINDINGS: Cardiac silhouette normal in size for AP technique, unchanged. Thoracic aorta atherosclerotic, unchanged. Hilar and mediastinal contours otherwise unremarkable. Emphysematous changes throughout both lungs as noted previously. Lungs clear. No pneumothorax. Pulmonary vascularity normal. No visible pleural effusions. IMPRESSION: COPD/emphysema.  No acute cardiopulmonary disease. Electronically Signed   By: Evangeline Dakin M.D.   On: 11/08/2016 10:14    Cardiac Studies   Chest CT 11/08/2016:Cardiovascular: No  filling defects within the pulmonary arteries arteries to suggest acute pulmonary embolism. Coronary artery calcification and aortic atherosclerotic calcification  Patient Profile     71 y.o. male with history of COPD, EtOH abuse, possible pulmonary infection/sepsis with lactic acid greater than 3 on admission, history of diastolic heart failure, hypertension, who is being seen by cardiology because of a "sensation of a lump in the mid chest area" on admission. Negative cardiac markers and normal EKG.  Assessment & Plan    1. Chest discomfort - "Lump in chest" has resolved. Cardiac markers are negative and EKG is normal. We will repeat EKG today. 2. Coronary artery disease based on the coincidental finding of coronary calcification on CT scan performed to exclude pulmonary emboli. No emboli were present. 3. COPD, severe, and seemingly improving. 4. SIRS, improving  The patient is rule out for myocardial infarction. His overall clinical condition is improving. He does have coronary disease and needs to have aggressive risk factor modification. When pulmonary status and other clinical concerns or improved, he will need to have an assessment  for obstructive coronary disease: Either stress echo or nuclear study. I will prefer a pharmacologic nuclear study but his COPD may not allow.  We will plan to have the patient follow-up with cardiology as an outpatient when he is well compensated from pulmonary standpoint.  We will sign off currently.  Signed, Sinclair Grooms, MD  11/10/2016, 9:36 AM

## 2016-11-10 NOTE — Progress Notes (Signed)
Patient stated that he takes nebulizer treatments q4-q6 at home. Feels as if he is doing better than yesterday. I am going to try to wean nebs to q6, and am changing from duoneb to just albuterol, since he takes spiriva QD. Will monitor to make sure these changes suit his needs.

## 2016-11-10 NOTE — Progress Notes (Signed)
Occupational Therapy Treatment Patient Details Name: Gabriel Stafford MRN: [id number] DOB: 1946-03-17 Today's Date: 11/10/2016    History of present illness Patient is a 71 y/o male who presents with tachypnea, tachycardia and hypoxia due to multiple factors including COPD exacerbation, acute on chronic CHF and alcohol withdrawal.    OT comments  Pt currently requiring supervision for gathering ADL items and performing grooming tasks x3 standing at the sink. Pt also requiring supervision for functional mobility without use of AD. Reviewed energy conservation strategies; pt reports he already uses many of these at home. D/c plan remains appropriate. Will continue to follow acutely.   Follow Up Recommendations  No OT follow up;Supervision - Intermittent    Equipment Recommendations  Tub/shower seat    Recommendations for Other Services      Precautions / Restrictions Precautions Precautions: Fall Restrictions Weight Bearing Restrictions: No       Mobility Bed Mobility Overal bed mobility: Modified Independent                Transfers Overall transfer level: Needs assistance Equipment used: None Transfers: Sit to/from Stand Sit to Stand: Supervision         General transfer comment: for safety    Balance Overall balance assessment: Needs assistance;History of Falls Sitting-balance support: Feet supported;No upper extremity supported Sitting balance-Leahy Scale: Good     Standing balance support: No upper extremity supported;During functional activity Standing balance-Leahy Scale: Fair                             ADL either performed or assessed with clinical judgement   ADL Overall ADL's : Needs assistance/impaired     Grooming: Supervision/safety;Standing;Wash/dry face;Oral care;Brushing hair Grooming Details (indicate cue type and reason): pt able to gather ADL items and perform tasks standing at the sink with supervision overall                  Toilet Transfer: Supervision/safety;Ambulation Toilet Transfer Details (indicate cue type and reason): simulated by sit to stand from EOB with functional mobility in room         Functional mobility during ADLs: Supervision/safety General ADL Comments: Reviewed energy conservation strategies with pt; he reports he already uses many of these strategies at home- taking rest breaks during functional activities, sitting during tasks, pursed lip breathing. SpO2 91% on 6L O2 following activity.     Vision       Perception     Praxis      Cognition Arousal/Alertness: Awake/alert Behavior During Therapy: WFL for tasks assessed/performed Overall Cognitive Status: Within Functional Limits for tasks assessed                                          Exercises     Shoulder Instructions       General Comments      Pertinent Vitals/ Pain       Pain Assessment: No/denies pain  Home Living                                          Prior Functioning/Environment              Frequency  Min 2X/week  Progress Toward Goals  OT Goals(current goals can now be found in the care plan section)  Progress towards OT goals: Progressing toward goals  Acute Rehab OT Goals Patient Stated Goal: go home tomorrow OT Goal Formulation: With patient  Plan Discharge plan remains appropriate    Co-evaluation                 AM-PAC PT "6 Clicks" Daily Activity     Outcome Measure   Help from another person eating meals?: None Help from another person taking care of personal grooming?: A Little Help from another person toileting, which includes using toliet, bedpan, or urinal?: A Little Help from another person bathing (including washing, rinsing, drying)?: A Little Help from another person to put on and taking off regular upper body clothing?: A Little Help from another person to put on and taking off regular lower body  clothing?: A Little 6 Click Score: 19    End of Session Equipment Utilized During Treatment: Oxygen  OT Visit Diagnosis: Muscle weakness (generalized) (M62.81);Unsteadiness on feet (R26.81)   Activity Tolerance Patient tolerated treatment well   Patient Left in chair;with call bell/phone within reach   Nurse Communication          Time: 9150-5697 OT Time Calculation (min): 14 min  Charges: OT General Charges $OT Visit: 1 Procedure OT Treatments $Self Care/Home Management : 8-22 mins  Cynde Menard A. Ulice Brilliant, M.S., OTR/L Pager: [phone] 11/10/2016, 4:07 PM

## 2016-11-10 NOTE — Progress Notes (Signed)
Physical Therapy Treatment Patient Details Name: Gabriel Stafford MRN: [id number] DOB: [birth year] Today's Date: 11/10/2016    History of Present Illness Patient is a 71 y/o male who presents with tachypnea, tachycardia and hypoxia due to multiple factors including COPD exacerbation, acute on chronic CHF and alcohol withdrawal.     PT Comments    Patient progressing with ambulation distance, activity tolerance and balance, but still unsteady without support at times.  Feel he would benefit from HHPT to address continued balance and safety issues at d/c.  Will attempt with cane next session and simulate narrow doorways/home environment.  Spoke with RN CM about HHPT.   Follow Up Recommendations  Home health PT     Equipment Recommendations  None recommended by PT    Recommendations for Other Services       Precautions / Restrictions Precautions Precautions: Fall Restrictions Weight Bearing Restrictions: No    Mobility  Bed Mobility               General bed mobility comments: sitting EOB  Transfers   Equipment used: None Transfers: Sit to/from Stand Sit to Stand: Min guard         General transfer comment: for balance/lines  Ambulation/Gait Ambulation/Gait assistance: Min guard Ambulation Distance (Feet): 200 Feet (one standing rest break) Assistive device: None Gait Pattern/deviations: Step-through pattern;Drifts right/left;Wide base of support     General Gait Details: SpO2 on 6L O2 per pt request, 91%, HR max 132; several small LOB ambulating without device this session with min A for safety/recovery   Stairs            Wheelchair Mobility    Modified Rankin (Stroke Patients Only)       Balance Overall balance assessment: Needs assistance;History of Falls Sitting-balance support: Feet supported Sitting balance-Leahy Scale: Good     Standing balance support: No upper extremity supported Standing balance-Leahy Scale: Fair Standing balance  comment: ambulated this session no device, minguard to min A for balance during ambulation                            Cognition Arousal/Alertness: Awake/alert Behavior During Therapy: WFL for tasks assessed/performed Overall Cognitive Status: Within Functional Limits for tasks assessed                                        Exercises      General Comments General comments (skin integrity, edema, etc.): HR max 132, SpO2 91% on 6L O2      Pertinent Vitals/Pain Pain Assessment: No/denies pain    Home Living                      Prior Function            PT Goals (current goals can now be found in the care plan section) Progress towards PT goals: Progressing toward goals    Frequency    Min 3X/week      PT Plan Discharge plan needs to be updated    Co-evaluation              AM-PAC PT "6 Clicks" Daily Activity  Outcome Measure  Difficulty turning over in bed (including adjusting bedclothes, sheets and blankets)?: None Difficulty moving from lying on back to sitting on the side of the bed? : None Difficulty sitting  down on and standing up from a chair with arms (e.g., wheelchair, bedside commode, etc,.)?: Total Help needed moving to and from a bed to chair (including a wheelchair)?: A Little Help needed walking in hospital room?: A Little Help needed climbing 3-5 steps with a railing? : A Little 6 Click Score: 18    End of Session Equipment Utilized During Treatment: Gait belt;Oxygen Activity Tolerance: Patient tolerated treatment well Patient left: in bed;with call bell/phone within reach;with bed alarm set (sitting EOB)   PT Visit Diagnosis: Other abnormalities of gait and mobility (R26.89)     Time: 4259-5638 PT Time Calculation (min) (ACUTE ONLY): 12 min  Charges:  $Gait Training: 8-22 mins                    G CodesMagda Kiel, [place] [phone] 11/10/2016    Reginia Naas 11/10/2016, 12:04 PM

## 2016-11-11 LAB — CBC
HEMATOCRIT: 31 % — AB (ref 39.0–52.0)
HEMOGLOBIN: 10 g/dL — AB (ref 13.0–17.0)
MCH: 32.7 pg (ref 26.0–34.0)
MCHC: 32.3 g/dL (ref 30.0–36.0)
MCV: 101.3 fL — AB (ref 78.0–100.0)
Platelets: 142 10*3/uL — ABNORMAL LOW (ref 150–400)
RBC: 3.06 MIL/uL — ABNORMAL LOW (ref 4.22–5.81)
RDW: 12.5 % (ref 11.5–15.5)
WBC: 7.4 10*3/uL (ref 4.0–10.5)

## 2016-11-11 LAB — BASIC METABOLIC PANEL
ANION GAP: 7 (ref 5–15)
BUN: 16 mg/dL (ref 6–20)
CHLORIDE: 98 mmol/L — AB (ref 101–111)
CO2: 33 mmol/L — ABNORMAL HIGH (ref 22–32)
Calcium: 8.8 mg/dL — ABNORMAL LOW (ref 8.9–10.3)
Creatinine, Ser: 0.93 mg/dL (ref 0.61–1.24)
GFR calc Af Amer: >60 mL/min (ref >60)
GFR calc non Af Amer: >60 mL/min (ref >60)
GLUCOSE: 128 mg/dL — AB (ref 65–99)
POTASSIUM: 3.4 mmol/L — AB (ref 3.5–5.1)
SODIUM: 138 mmol/L (ref 135–145)

## 2016-11-11 MED ORDER — TRAZODONE HCL 50 MG PO TABS: 50.0000 mg | ORAL_TABLET | Freq: Every day | ORAL | 0 refills | Status: AC

## 2016-11-11 MED ORDER — NICOTINE POLACRILEX 2 MG MT GUM: 2.0000 mg | CHEWING_GUM | OROMUCOSAL | 0 refills | Status: DC | PRN

## 2016-11-11 MED ORDER — ASPIRIN 81 MG PO TBEC
81.0000 mg | DELAYED_RELEASE_TABLET | Freq: Every day | ORAL | 0 refills | Status: AC
Start: 2016-11-12 — End: ?

## 2016-11-11 MED ORDER — PREDNISONE 50 MG PO TABS: 50.0000 mg | ORAL_TABLET | Freq: Every day | ORAL | 0 refills | Status: AC

## 2016-11-11 MED ORDER — TORSEMIDE 20 MG PO TABS: 40.0000 mg | ORAL_TABLET | Freq: Every day | ORAL | 0 refills | Status: DC

## 2016-11-11 MED ORDER — LEVOFLOXACIN 500 MG PO TABS: 500.0000 mg | ORAL_TABLET | Freq: Every day | ORAL | 0 refills | Status: AC

## 2016-11-11 NOTE — Discharge Summary (Signed)
[facility] Discharge Summary  Patient name: Gabriel Stafford Medical record number: [id number] Date of birth: Jan 31, 1946 Age: 71 y.o. Gender: male Date of Admission: 11/08/2016  Date of Discharge:11/11/2016 Admitting Physician: Zenia Resides, MD  Primary Care Provider: System, Pcp Not In Consultants: Cardiology  Indication for Hospitalization: SOB and weakness  Discharge Diagnoses/Problem List:  COPD exacerbation CHF HTN Alcohol abuse Tobacco abuse  Disposition: Home with home health PT and nurse aid  Discharge Condition: Stable and improving  Discharge Exam:   General: NAD, pleasant, able to participate in exam Cardiac: RRR, normal heart sounds, no murmurs. 2+ radial and PT pulses bilaterally Respiratory: Mild expiratory wheezes upper and lower lung field bilaterally, no crackles or increase work of breathing. Abdomen: soft, nontender, nondistended, no hepatic or splenomegaly, +BS Extremities: no edema or cyanosis. WWP. Skin: warm and dry, no rashes noted Neuro: alert and oriented x4, no focal deficits Psych: Normal affect and mood   Brief Hospital Course:  Gabriel Stafford a 72 y.o.male with a past medical history significant for COPD on 4L O2, CHF w/ EF of 50%, HTN, tobacco abuseand alcohol abuse who presented with weakness and SOB consistent with asthma exacerbation.  Patient initially met SIRS criteria and was started on Vancomycin and Zosyn without any obvious source of infection. CXR was unremarkable but patient had significant wheezes bilaterally. Patient was started on breathing treatment regimen and prednisone while on 4L [place] (baseline). Patient also had bilateral lower extremity edema and increase abdominal girth which was was concerning for volume overload and CHF exacerbation. Troponin were negative and EKG was not concerning.  Patient had elevated D dimers, but CTA was negative for PE in the setting of his tachycardia. Given chest pain  described by patient cardiology was consulted. They felt patient symptoms were not of cardiac etiology and recommended that patient follow up in the outpatient setting for a stress test and to continue diuresis. Patient was started on aspirin. Given patient extensive history of alcohol abuse as well as mild transaminitis on admission, team was concerned about cirrhosis. Abdominal US show hepatic steatosis but no ascites. Patient also reported AMS and difficulty with speech prior to admission concerning for possible stroke. MRI had normal finding for age. Patient continue to improve, blood and urine cultures were negative and patient was transitioned to [place]. Patient continue to improve during hospitalization and was discharged on 4L [place] and to complete steroid and antibiotic course for his COPD exacer. Patient will have close follow up with PCP.  Issues for Follow Up:  1. Patient will complete five day course of prednisone for COPD exacerbation end on 6/13. 2. Patient will complete one day of levaquin 3. Consider Roflumilast to reduce exacerbations and hospital readmission. 4. Patient switch to 40 mg torsemide once a day. 5. Patient was also started on aspirin 81 mg. 6. Patient is interested in smoking cessation, discharged with nicorette gum  Significant Procedures: None  Significant Labs and Imaging:   Recent Labs Lab 11/09/16 0300 11/10/16 0404 11/11/16 0615  WBC 8.3 8.4 7.4  HGB 10.0* 9.8* 10.0*  HCT 29.9* 30.0* 31.0*  PLT 136* 135* 142*    Recent Labs Lab 11/08/16 0938 11/09/16 0300 11/10/16 0404 11/11/16 0615  NA 138 133* 139 138  K 4.2 4.0 3.6 3.4*  CL 100* 99* 100* 98*  CO2 23 26 29  33*  GLUCOSE 105* 191* 94 128*  BUN 24* 24* 24* 16  CREATININE 1.03 0.94 1.00 0.93  CALCIUM 9.0 8.6* 8.5*  8.8*  ALKPHOS 70 57  --   --   AST 95* 76*  --   --   ALT 98* 85*  --   --   ALBUMIN 4.3 3.5  --   --    Troponin (Point of Care Test) No results for input(s): TROPIPOC in the  last 72 hours.   Ct Head Wo Contrast  Result Date: 11/08/2016 CLINICAL DATA:  Altered mental status/confusion EXAM: CT HEAD WITHOUT CONTRAST TECHNIQUE: Contiguous axial images were obtained from the base of the skull through the vertex without intravenous contrast. COMPARISON:  None. FINDINGS: Brain: Ventricles and sulci appear within normal limits for age. There is no intracranial mass, hemorrhage, extra-axial fluid collection, or midline shift. Gray-white compartments appear normal. No acute infarct is demonstrable. Vascular: There is no hyperdense vessel. There is no appreciable vascular calcification. Skull: Bony calvarium appears intact. Sinuses/Orbits: Visualized paranasal sinuses are clear. Orbits appear symmetric bilaterally. Other: Mastoid air cells are clear. IMPRESSION: Study within normal limits for age. Electronically Signed   By: Lowella Grip III M.D.   On: 11/08/2016 11:52   Ct Angio Chest Pe W Or Wo Contrast  Result Date: 11/08/2016 CLINICAL DATA:  Congestive heart failure. Respiratory distress. Short of breath and confusion. EXAM: CT ANGIOGRAPHY CHEST WITH CONTRAST TECHNIQUE: Multidetector CT imaging of the chest was performed using the standard protocol during bolus administration of intravenous contrast. Multiplanar CT image reconstructions and MIPs were obtained to evaluate the vascular anatomy. CONTRAST:  80 cc Isovue COMPARISON:  Chest CT 09/27/2016 FINDINGS: Cardiovascular: No filling defects within the pulmonary arteries arteries to suggest acute pulmonary embolism. Coronary artery calcification and aortic atherosclerotic calcification. Mediastinum/Nodes: No axillary supraclavicular adenopathy. No mediastinal hilar adenopathy. No pericardial fluid. Esophagus normal. Lungs/Pleura: Centrilobular emphysema in the upper lobes. Pleural thickening along the LEFT and RIGHT oblique fissures. No pulmonary infarction. No pulmonary edema, infiltrate, or pneumothorax. Mild linear thickening  along the oblique fissures has increased compared to most recent CT Upper Abdomen: Low-attenuation liver may represent hepatic steatosis. Musculoskeletal: No aggressive osseous lesion. Review of the MIP images confirms the above findings. IMPRESSION: 1. No acute pulmonary embolism. 2. Pleural thickening along the oblique fissures is increased from comparison exam. 3. No pulmonary infarction or pneumonia. 4. Hepatic steatosis Electronically Signed   By: Suzy Bouchard M.D.   On: 11/08/2016 19:12   Mr Jeri Cos XH Contrast  Result Date: 11/10/2016 CLINICAL DATA:  Initial evaluation for acute aphasia. EXAM: MRI HEAD WITHOUT AND WITH CONTRAST TECHNIQUE: Multiplanar, multiecho pulse sequences of the brain and surrounding structures were obtained without and with intravenous contrast. CONTRAST:  20 cc of MultiHance. COMPARISON:  Prior CT from 11/08/2016. FINDINGS: Brain: Generalized age-related cerebral atrophy. Minimal patchy T2/FLAIR hyperintensity within the periventricular and deep white matter both cerebral hemispheres as well as within the pons. Changes are nonspecific, but most like related to chronic small vessel ischemic disease, and felt to be within normal limits for age. No abnormal foci of restricted diffusion to suggest acute or subacute ischemia. Gray-white matter differentiation maintained. No areas of chronic infarction identified. No evidence for acute or chronic intracranial hemorrhage. No mass lesion, midline shift or mass effect. Ventricles normal size without evidence for hydrocephalus. No extra-axial fluid collection. Major dural sinuses are grossly patent. No abnormal enhancement. Pituitary gland within normal limits. Midline structures intact and normal. Vascular: Major intracranial vascular flow voids are well maintained. Skull and upper cervical spine: Craniocervical junction within normal limits. Mild degenerative thickening noted at the tectorial  membrane without significant stenosis.  Remainder the visualized upper cervical spine otherwise unremarkable. Bone marrow signal intensity within normal limits. No scalp soft tissue abnormality. Sinuses/Orbits: Globes and orbital soft tissues within normal limits. Mild scattered mucosal thickening within the ethmoidal air cells and maxillary sinuses. Paranasal sinuses are otherwise clear. No mastoid effusion. Inner ear structures normal. Other: None. IMPRESSION: Normal brain MRI for age.  No acute intracranial process identified. Electronically Signed   By: Jeannine Boga M.D.   On: 11/10/2016 03:27   US Abdomen Complete  Result Date: 11/09/2016 CLINICAL DATA:  Abdominal distension with elevated LFTs EXAM: ABDOMEN ULTRASOUND COMPLETE COMPARISON:  None. FINDINGS: Gallbladder: No gallstones or wall thickening visualized. No sonographic Murphy sign noted by sonographer. Common bile duct: Diameter: Normal at 3.7 mm Liver: Coarse heterogenous increased echogenicity. No focal hepatic abnormality. IVC: No abnormality visualized. Pancreas: Poorly visualized due to bowel gas. Spleen: Size and appearance within normal limits. Right Kidney: Length: 12.9 cm. Echogenicity within normal limits. No hydronephrosis. Cyst in the lower pole measuring 2.7 x 2.1 x 2.5 cm Left Kidney: Length: 12.5 cm. Echogenicity within normal limits. No mass or hydronephrosis visualized. Abdominal aorta: Ectatic proximal segment up to 3 cm. Mid to distal aorta obscured by bowel gas. Other findings: None. IMPRESSION: 1. Coarse echogenic liver suggesting fatty infiltration. No gallstones or biliary dilatation 2. 2.7 cm simple cyst in the right kidney 3. Abdominal aorta measures 3 cm in maximum diameter proximally. Recommend followup by ultrasound in 3 years. This recommendation follows ACR consensus guidelines: White Paper of the ACR Incidental Findings Committee II on Vascular Findings. Natasha Mead Coll Radiol 2013; 10:789-794 Electronically Signed   By: Donavan Foil M.D.   On: 11/09/2016  15:58   Dg Chest Portable 1 View  Result Date: 11/08/2016 CLINICAL DATA:  71 year old presenting with acute onset of chest pain and shortness of breath. Current history of COPD, hypertension and CHF. Current smoker. EXAM: PORTABLE CHEST 1 VIEW COMPARISON:  Chest x-ray and CTA chest 09/27/2016. FINDINGS: Cardiac silhouette normal in size for AP technique, unchanged. Thoracic aorta atherosclerotic, unchanged. Hilar and mediastinal contours otherwise unremarkable. Emphysematous changes throughout both lungs as noted previously. Lungs clear. No pneumothorax. Pulmonary vascularity normal. No visible pleural effusions. IMPRESSION: COPD/emphysema.  No acute cardiopulmonary disease. Electronically Signed   By: Evangeline Dakin M.D.   On: 11/08/2016 10:14    Results/Tests Pending at Time of Discharge: None    Discharge Medications:  Allergies as of 11/11/2016   No Known Allergies     Medication List    STOP taking these medications   furosemide 40 MG tablet Commonly known as:  LASIX     TAKE these medications   albuterol 108 (90 Base) MCG/ACT inhaler Commonly known as:  PROVENTIL HFA;VENTOLIN HFA Inhale 2 puffs into the lungs every 6 (six) hours as needed for wheezing or shortness of breath.   ARNUITY ELLIPTA 200 MCG/ACT Aepb Generic drug:  Fluticasone Furoate Take 1 puff by mouth every morning.   aspirin 81 MG EC tablet Take 1 tablet (81 mg total) by mouth daily. Start taking on:  06/12/4942   folic acid 1 MG tablet Commonly known as:  FOLVITE Take 1 tablet (1 mg total) by mouth daily.   ipratropium-albuterol 0.5-2.5 (3) MG/3ML Soln Commonly known as:  DUONEB Take 3 mLs by nebulization every 4 (four) hours as needed for wheezing or shortness of breath.   levofloxacin 500 MG tablet Commonly known as:  LEVAQUIN Take 1 tablet (500 mg total) by  mouth daily. Start taking on:  11/12/2016   losartan 50 MG tablet Commonly known as:  COZAAR Take 50 mg by mouth every morning.   nicotine  polacrilex 2 MG gum Commonly known as:  NICORETTE Take 1 each (2 mg total) by mouth as needed for smoking cessation.   potassium chloride SA 20 MEQ tablet Commonly known as:  K-DUR,KLOR-CON Take 2 tablets (40 mEq total) by mouth daily.   predniSONE 50 MG tablet Commonly known as:  DELTASONE Take 1 tablet (50 mg total) by mouth daily with breakfast. What changed:  medication strength  how much to take  how to take this  when to take this  additional instructions   STIOLTO RESPIMAT 2.5-2.5 MCG/ACT Aers Generic drug:  Tiotropium Bromide-Olodaterol Take 2 puffs by mouth every morning.   torsemide 20 MG tablet Commonly known as:  DEMADEX Take 2 tablets (40 mg total) by mouth daily. What changed:  when to take this  additional instructions   traZODone 50 MG tablet Commonly known as:  DESYREL Take 1 tablet (50 mg total) by mouth at bedtime.            Durable Medical Equipment        Start     Ordered   11/11/16 1218  For home use only DME Shower stool  Once     11/11/16 1217      Discharge Instructions: Please refer to Patient Instructions section of EMR for full details.  Patient was counseled important signs and symptoms that should prompt return to medical care, changes in medications, dietary instructions, activity restrictions, and follow up appointments.   Follow-Up Appointments:   Marjie Skiff, MD 11/11/2016, 3:15 PM PGY-1, [place]

## 2016-11-11 NOTE — Progress Notes (Signed)
Patient discharged to home with instructions. 

## 2016-11-11 NOTE — Progress Notes (Signed)
Physical Therapy Treatment Patient Details Name: Gabriel Stafford MRN: [id number] DOB: 1946/01/02 Today's Date: 11/11/2016    History of Present Illness Patient is a 71 y/o male who presents with tachypnea, tachycardia and hypoxia due to multiple factors including COPD exacerbation, acute on chronic CHF and alcohol withdrawal.     PT Comments    Patient is progressing toward mobility goals. 6L O2 via [place] required when ambulating to maintain SpO2 90-91%. Current plan remains appropriate.    Follow Up Recommendations  Home health PT     Equipment Recommendations  Cane    Recommendations for Other Services       Precautions / Restrictions Precautions Precautions: Fall Precaution Comments: watch 02    Mobility  Bed Mobility               General bed mobility comments: pt OOB in chair upon arrival  Transfers Overall transfer level: Modified independent Equipment used: None Transfers: Sit to/from Stand           General transfer comment: increased effort  Ambulation/Gait Ambulation/Gait assistance: Supervision Ambulation Distance (Feet): 200 Feet Assistive device: None;Straight cane Gait Pattern/deviations: Step-through pattern;Wide base of support;Trunk flexed     General Gait Details: practiced gait with SPC with cues for sequencing; pt more steady and reported it helped with R hip pain; rest break required; cues for posture and pursed lip breathing; 6L O2 via [place] and SpO2 90-91%   Stairs            Wheelchair Mobility    Modified Rankin (Stroke Patients Only)       Balance                                            Cognition Arousal/Alertness: Awake/alert Behavior During Therapy: WFL for tasks assessed/performed Overall Cognitive Status: Within Functional Limits for tasks assessed                                        Exercises      General Comments General comments (skin integrity, edema, etc.): HR  elevated with mobility and 110s at rest; discussed energy conservation techniques      Pertinent Vitals/Pain Pain Assessment: Faces Faces Pain Scale: Hurts a little bit Pain Location: R hip Pain Descriptors / Indicators: Aching Pain Intervention(s): Monitored during session    Home Living                      Prior Function            PT Goals (current goals can now be found in the care plan section) Progress towards PT goals: Progressing toward goals    Frequency    Min 3X/week      PT Plan Current plan remains appropriate    Co-evaluation              AM-PAC PT "6 Clicks" Daily Activity  Outcome Measure  Difficulty turning over in bed (including adjusting bedclothes, sheets and blankets)?: None Difficulty moving from lying on back to sitting on the side of the bed? : None Difficulty sitting down on and standing up from a chair with arms (e.g., wheelchair, bedside commode, etc,.)?: A Little Help needed moving to and from a bed to chair (including a  wheelchair)?: None Help needed walking in hospital room?: A Little Help needed climbing 3-5 steps with a railing? : A Little 6 Click Score: 21    End of Session Equipment Utilized During Treatment: Gait belt;Oxygen Activity Tolerance: Patient tolerated treatment well Patient left: in chair;with call bell/phone within reach Nurse Communication: Mobility status PT Visit Diagnosis: Other abnormalities of gait and mobility (R26.89)     Time: 7673-4193 PT Time Calculation (min) (ACUTE ONLY): 18 min  Charges:  $Gait Training: 8-22 mins                    G Codes:       Gabriel Stafford, PTA Pager: [phone]     Darliss Cheney 11/11/2016, 4:12 PM

## 2016-11-13 LAB — CULTURE, BLOOD (ROUTINE X 2)
CULTURE: NO GROWTH
CULTURE: NO GROWTH
SPECIAL REQUESTS: ADEQUATE
Special Requests: ADEQUATE

## 2016-12-15 ENCOUNTER — Ambulatory Visit (INDEPENDENT_AMBULATORY_CARE_PROVIDER_SITE_OTHER): Payer: BLUE CROSS/BLUE SHIELD | Admitting: Interventional Cardiology

## 2016-12-15 ENCOUNTER — Encounter: Payer: Self-pay | Admitting: Interventional Cardiology

## 2016-12-15 VITALS — BP 102/60 | HR 82 | Ht 69.0 in | Wt 197.4 lb

## 2016-12-15 DIAGNOSIS — I5032 Chronic diastolic (congestive) heart failure: Secondary | ICD-10-CM | POA: Diagnosis not present

## 2016-12-15 DIAGNOSIS — R0789 Other chest pain: Secondary | ICD-10-CM | POA: Diagnosis not present

## 2016-12-15 DIAGNOSIS — I10 Essential (primary) hypertension: Secondary | ICD-10-CM

## 2016-12-15 DIAGNOSIS — I2510 Atherosclerotic heart disease of native coronary artery without angina pectoris: Secondary | ICD-10-CM

## 2016-12-15 DIAGNOSIS — J441 Chronic obstructive pulmonary disease with (acute) exacerbation: Secondary | ICD-10-CM | POA: Diagnosis not present

## 2016-12-15 NOTE — Progress Notes (Signed)
Cardiology Office Note    Date:  12/15/2016   ID:  Gabriel Stafford, DOB 12-24-1945, MRN [id number]  PCP:  System, Pcp Not In  Cardiologist: Sinclair Grooms, MD   Chief Complaint  Patient presents with  . Congestive Heart Failure  . Shortness of Breath    History of Present Illness:  Gabriel Stafford is a 71 y.o. male the patient is being seen as an outpatient after a hospitalization earlier this year, dyspnea, and identification of coronary calcification.  Summary of hospital consult that was performed by me: "  Chest pain is "like a lump in the chest" but there is no active evidence of ischemia on ECG or cardiac markers.  He does have CAD denoted by coronary calcification seen coincidentally on CT scan.  Recommend treat COPD and infection. At some later date, as OP,  consider and ischemic w/u with nuclear stress testing.  Recommend LDL 70, BP control, aspirin, smoking cessation, and evaluation of glycemic control.  He does not appear to be volume overloaded (other than mild ankle edema) or in acute diastolic heart failure currently."  He now feels that he is doing well. He is on chronic O2 related to severe COPD. He has stopped smoking. He denies chest discomfort. Minimal activity causes significant increase in heart rate. We are unable to use beta blocker therapy because of COPD with wheezing. Echocardiogram performed at [facility] demonstrated EF of 28-78% with diastolic dysfunction. He was admitted to the hospital with decompensated acute diastolic heart failure in the spring and aggressive diuretic therapy was started. He has been more stable since that time. He is taking the medications as previously prescribed.  Past Medical History:  Diagnosis Date  . Abdominal distention   . Acute respiratory failure with hypoxemia ([place]) 09/27/2016  . Alcohol withdrawal syndrome without complication ([place])   . Chest discomfort 11/09/2016  . CHF  (congestive heart failure) ([place])   . COPD (chronic obstructive pulmonary disease) ([place])   . COPD exacerbation ([place]) 09/27/2016  . Coronary artery calcification seen on CAT scan 11/09/2016  . HTN (hypertension), benign 09/27/2016  . SIRS (systemic inflammatory response syndrome) ([place]) 11/08/2016    Past Surgical History:  Procedure Laterality Date  . CYST REMOVAL NECK      Current Medications: Outpatient Medications Prior to Visit  Medication Sig Dispense Refill  . albuterol (PROVENTIL HFA;VENTOLIN HFA) 108 (90 Base) MCG/ACT inhaler Inhale 2 puffs into the lungs every 6 (six) hours as needed for wheezing or shortness of breath.    . ARNUITY ELLIPTA 200 MCG/ACT AEPB Take 1 puff by mouth every morning.  1  . aspirin EC 81 MG EC tablet Take 1 tablet (81 mg total) by mouth daily. 30 tablet 0  . folic acid (FOLVITE) 1 MG tablet Take 1 tablet (1 mg total) by mouth daily. 30 tablet 0  . ipratropium-albuterol (DUONEB) 0.5-2.5 (3) MG/3ML SOLN Take 3 mLs by nebulization every 4 (four) hours as needed for wheezing or shortness of breath.  1  . losartan (COZAAR) 50 MG tablet Take 50 mg by mouth every morning.  0  . nicotine polacrilex (NICORETTE) 2 MG gum Take 1 each (2 mg total) by mouth as needed for smoking cessation. 100 tablet 0  . potassium chloride SA (K-DUR,KLOR-CON) 20 MEQ tablet Take 2 tablets (40 mEq total) by mouth daily. 60 tablet 0  . STIOLTO RESPIMAT 2.5-2.5 MCG/ACT AERS Take 2 puffs by mouth every morning.  1  .  torsemide (DEMADEX) 20 MG tablet Take 2 tablets (40 mg total) by mouth daily. 60 tablet 0  . traZODone (DESYREL) 50 MG tablet Take 1 tablet (50 mg total) by mouth at bedtime. 30 tablet 0   No facility-administered medications prior to visit.      Allergies:   Patient has no known allergies.   Social History   Social History  . Marital status: Married    Spouse name: N/A  . Number of children: N/A  . Years of education: N/A   Social History Main Topics  . Smoking status:  Current Some Day Smoker    Types: Cigarettes  . Smokeless tobacco: Never Used  . Alcohol use Yes  . Drug use: No  . Sexual activity: Not Currently   Other Topics Concern  . None   Social History Narrative  . None     Family History:  The patient's family history is not on file.   ROS:   Please see the history of present illness.    Chronically short of breath. Audible wheezing. Excessive fatigue. Improved appetite. Snoring. No chest pain. No chest pressure, tightness, or burning.  All other systems reviewed and are negative.   PHYSICAL EXAM:   VS:  BP 102/60 (BP Location: Right Arm)   Pulse 82   Ht 5\' 9"  (1.753 m)   Wt 197 lb 6.4 oz (89.5 kg)   BMI 29.15 kg/m    GEN: Well nourished, well developed, in no acute distress  HEENT: normal  Neck: no JVD, carotid bruits, or masses Cardiac: RRR; no murmurs, rubs, or gallops,no edema  Respiratory: Diffuse bilateral wheezing. GI: soft, nontender, nondistended, + BS MS: no deformity or atrophy  Skin: warm and dry, no rash Neuro:  Alert and Oriented x 3, Strength and sensation are intact Psych: euthymic mood, full affect  Wt Readings from Last 3 Encounters:  12/15/16 197 lb 6.4 oz (89.5 kg)  11/11/16 198 lb 13.7 oz (90.2 kg)  09/30/16 194 lb 9.6 oz (88.3 kg)      Studies/Labs Reviewed:   EKG:  EKG  Not repeated.  Recent Labs: 09/29/2016: Magnesium 2.0 11/08/2016: B Natriuretic Peptide 29.3 11/09/2016: ALT 85; TSH 1.380 11/11/2016: BUN 16; Creatinine, Ser 0.93; Hemoglobin 10.0; Platelets 142; Potassium 3.4; Sodium 138   Lipid Panel    Component Value Date/Time   CHOL 219 (H) 11/09/2016 1814   TRIG 52 11/09/2016 1814   HDL 131 11/09/2016 1814   CHOLHDL 1.7 11/09/2016 1814   VLDL 10 11/09/2016 1814   LDLCALC 78 11/09/2016 1814    Additional studies/ records that were reviewed today include:   Chest CT angiogram to rule out PE 11/08/2016: FINDINGS: Cardiovascular: No filling defects within the pulmonary  arteries arteries to suggest acute pulmonary embolism. Coronary artery calcification and aortic atherosclerotic calcification.  Mediastinum/Nodes: No axillary supraclavicular adenopathy. No mediastinal hilar adenopathy. No pericardial fluid. Esophagus normal.  Lungs/Pleura: Centrilobular emphysema in the upper lobes. Pleural thickening along the LEFT and RIGHT oblique fissures.  No pulmonary infarction. No pulmonary edema, infiltrate, or pneumothorax.  Mild linear thickening along the oblique fissures has increased compared to most recent CT  Upper Abdomen: Low-attenuation liver may represent hepatic steatosis.  Musculoskeletal: No aggressive osseous lesion.  Review of the MIP images confirms the above findings.  IMPRESSION: 1. No acute pulmonary embolism. 2. Pleural thickening along the oblique fissures is increased from comparison exam. 3. No pulmonary infarction or pneumonia. 4. Hepatic steatosis     ASSESSMENT:  1. Chest discomfort   2. Coronary artery calcification seen on CAT scan   3. HTN (hypertension), benign   4. Chronic diastolic heart failure ([place])   5. COPD exacerbation ([place])      PLAN:  In order of problems listed above:  1. He currently denies chest discomfort. He is ambulatory on chronic oxygen without discomfort. He does get short of breath with activity. He wheezes loudly. He is on chronic O2 therapy. He is noted to have aortic atherosclerosis and coronary calcification by CT scanning. To evaluate the severity of coronary disease he would need either angiography by catheter or myocardial perfusion imaging. He is not able to walk on a treadmill. Walking down the hall to get into the office increases her heart rate into the 140s. At those heart rate he has no chest discomfort. At this point I believe no further evaluation for obstructive coronary disease is indicated. Management would not include coronary bypass surgery because of his severe  lung disease. He is cautioned to call if he develops chest discomfort and certainly if acute coronary syndrome occurs PCI would be the treatment of choice. 2. Aspirin one per day as prescribed. The most recent lipid profile from June 2018 revealed an HDL of 131 LDL of 78 total cholesterol of 219 triglyceride of 52. Lipids should be monitored over time. No specific pharmacologic therapy is recommended. 3. There is no issue with hypertension at this time, in fact blood pressures are relatively low. 4. On the current diuretic regimen, the patient's volume status is normal. There is no evidence of overt heart failure. Continue diuretic therapy as listed. We spent considerable time discussing chronic diastolic heart failure (HFpEF) in terms that he could understand. No additions to therapy at this time. 5. Continue chronic oxygen at 3-4 L/m as recommended.  At this point I did not believe chronic recurrent visits to cardiology are needed. If he develops evidence of volume overload or begins having symptoms that suggest myocardial ischemia, we will be happy to see him again in the future.  Medication Adjustments/Labs and Tests Ordered: Current medicines are reviewed at length with the patient today.  Concerns regarding medicines are outlined above.  Medication changes, Labs and Tests ordered today are listed in the Patient Instructions below. Patient Instructions  Medication Instructions:  None  Labwork: None  Testing/Procedures: None  Follow-Up: Your physician recommends that you schedule a follow-up appointment as needed with Dr. Tamala Julian.    Any Other Special Instructions Will Be Listed Below (If Applicable).     If you need a refill on your cardiac medications before your next appointment, please call your pharmacy.      Signed, Sinclair Grooms, MD  12/15/2016 12:09 PM    [facility] [place], [address], [place]  [address] Phone: [phone]; Fax: [phone]

## 2016-12-15 NOTE — Patient Instructions (Signed)
Medication Instructions:  None  Labwork: None  Testing/Procedures: None  Follow-Up: Your physician recommends that you schedule a follow-up appointment as needed with Dr. Smith.    Any Other Special Instructions Will Be Listed Below (If Applicable).     If you need a refill on your cardiac medications before your next appointment, please call your pharmacy.   

## 2016-12-30 ENCOUNTER — Emergency Department (HOSPITAL_COMMUNITY): Payer: BLUE CROSS/BLUE SHIELD

## 2016-12-30 ENCOUNTER — Emergency Department (HOSPITAL_COMMUNITY)
Admission: EM | Admit: 2016-12-30 | Discharge: 2016-12-30 | Disposition: A | Discharge status: Home / Self Care | Payer: BLUE CROSS/BLUE SHIELD | Attending: Emergency Medicine | Admitting: Emergency Medicine

## 2016-12-30 ENCOUNTER — Encounter (HOSPITAL_COMMUNITY): Payer: Self-pay | Admitting: Emergency Medicine

## 2016-12-30 DIAGNOSIS — Z79899 Other long term (current) drug therapy: Secondary | ICD-10-CM | POA: Diagnosis not present

## 2016-12-30 DIAGNOSIS — I5032 Chronic diastolic (congestive) heart failure: Secondary | ICD-10-CM | POA: Diagnosis not present

## 2016-12-30 DIAGNOSIS — F17210 Nicotine dependence, cigarettes, uncomplicated: Secondary | ICD-10-CM | POA: Insufficient documentation

## 2016-12-30 DIAGNOSIS — Z7982 Long term (current) use of aspirin: Secondary | ICD-10-CM | POA: Diagnosis not present

## 2016-12-30 DIAGNOSIS — I110 Hypertensive heart disease with heart failure: Secondary | ICD-10-CM | POA: Diagnosis not present

## 2016-12-30 DIAGNOSIS — R42 Dizziness and giddiness: Secondary | ICD-10-CM | POA: Diagnosis present

## 2016-12-30 DIAGNOSIS — R404 Transient alteration of awareness: Secondary | ICD-10-CM | POA: Diagnosis not present

## 2016-12-30 DIAGNOSIS — E860 Dehydration: Secondary | ICD-10-CM | POA: Diagnosis not present

## 2016-12-30 DIAGNOSIS — N179 Acute kidney failure, unspecified: Secondary | ICD-10-CM | POA: Diagnosis not present

## 2016-12-30 DIAGNOSIS — J449 Chronic obstructive pulmonary disease, unspecified: Secondary | ICD-10-CM | POA: Diagnosis not present

## 2016-12-30 LAB — CBC
HCT: 33.2 % — ABNORMAL LOW (ref 39.0–52.0)
HEMOGLOBIN: 11.2 g/dL — AB (ref 13.0–17.0)
MCH: 34.1 pg — AB (ref 26.0–34.0)
MCHC: 33.7 g/dL (ref 30.0–36.0)
MCV: 101.2 fL — ABNORMAL HIGH (ref 78.0–100.0)
PLATELETS: 213 10*3/uL (ref 150–400)
RBC: 3.28 MIL/uL — AB (ref 4.22–5.81)
RDW: 12.9 % (ref 11.5–15.5)
WBC: 7.5 10*3/uL (ref 4.0–10.5)

## 2016-12-30 LAB — COMPREHENSIVE METABOLIC PANEL
ALK PHOS: 64 U/L (ref 38–126)
ALT: 35 U/L (ref 17–63)
AST: 39 U/L (ref 15–41)
Albumin: 4.3 g/dL (ref 3.5–5.0)
Anion gap: 11 (ref 5–15)
BUN: 44 mg/dL — AB (ref 6–20)
CALCIUM: 9.4 mg/dL (ref 8.9–10.3)
CHLORIDE: 96 mmol/L — AB (ref 101–111)
CO2: 30 mmol/L (ref 22–32)
CREATININE: 1.67 mg/dL — AB (ref 0.61–1.24)
GFR calc non Af Amer: 39 mL/min — ABNORMAL LOW (ref >60)
GFR, EST AFRICAN AMERICAN: 45 mL/min — AB (ref >60)
GLUCOSE: 117 mg/dL — AB (ref 65–99)
Potassium: 4.7 mmol/L (ref 3.5–5.1)
SODIUM: 137 mmol/L (ref 135–145)
Total Bilirubin: 0.9 mg/dL (ref 0.3–1.2)
Total Protein: 6.7 g/dL (ref 6.5–8.1)

## 2016-12-30 LAB — I-STAT TROPONIN, ED: Troponin i, poc: 0.01 ng/mL (ref 0.00–0.08)

## 2016-12-30 MED ORDER — SODIUM CHLORIDE 0.9 % IV BOLUS (SEPSIS)
1000.0000 mL | Freq: Once | INTRAVENOUS | Status: AC
  Administered 2016-12-30: 1000 mL via INTRAVENOUS

## 2016-12-30 MED ORDER — PREDNISONE 20 MG PO TABS
60.0000 mg | ORAL_TABLET | Freq: Once | ORAL | Status: AC
  Administered 2016-12-30: 60 mg via ORAL
  Filled 2016-12-30: qty 3

## 2016-12-30 MED ORDER — SODIUM CHLORIDE 0.9 % IV BOLUS (SEPSIS): 500.0000 mL | Freq: Once | INTRAVENOUS | Status: DC

## 2016-12-30 NOTE — Discharge Instructions (Signed)
Drink plenty of fluids this weekend. Please be careful when walking slower yourself to the sitting position if you become lightheaded. Recheck with your doctor next week for recheck of your creatinine. Return if you are worse at any time especially chest pain or shortness of breath.

## 2016-12-30 NOTE — ED Triage Notes (Signed)
Pt arrives [facility] from work where he had finished his normal neb treatment for [facility] and had sudden onset dizziness while sitting and emesis with movement. Pt given duoneb and albuterol 2.5 mg,  solumedrol 125mg , zofran 4 mg PTA.

## 2016-12-30 NOTE — ED Notes (Signed)
Pt verbalized understanding discharge instructions and denies any further needs or questions at this time. VS stable, ambulatory and steady gait.   

## 2016-12-30 NOTE — ED Notes (Signed)
Patient transported to CT 

## 2016-12-30 NOTE — ED Provider Notes (Signed)
[facility] DEPT Provider Note   CSN: [id number] Arrival date & time: 12/30/16  1219   An emergency department physician performed an initial assessment on this suspected stroke patient at 1230.  History   Chief Complaint Chief Complaint  Patient presents with  . Shortness of Breath  . Dizziness  . Emesis    HPI Gabriel Stafford is a 71 y.o. male.  HPI 71 year old male with severe oxygen dependent COPD, urinary artery disease, hypertension who presents today with dizziness. He describes the dizziness as more lightheadedness. He was sitting at his desk taking his albuterol treatment when he completed that he felt very lightheaded. They also describe some component of off balance. He denies any visual problems, lateralized deficits, difficulty speaking, or headache. He has been eating and drinking as usual. He denies any headache, neck pain, chest pain, or dyspnea beyond baseline. Past Medical History:  Diagnosis Date  . Abdominal distention   . Acute respiratory failure with hypoxemia ([place]) 09/27/2016  . Alcohol withdrawal syndrome without complication ([place])   . Chest discomfort 11/09/2016  . CHF (congestive heart failure) ([place])   . COPD (chronic obstructive pulmonary disease) ([place])   . COPD exacerbation ([place]) 09/27/2016  . Coronary artery calcification seen on CAT scan 11/09/2016  . HTN (hypertension), benign 09/27/2016  . SIRS (systemic inflammatory response syndrome) ([place]) 11/08/2016    Patient Active Problem List   Diagnosis Date Noted  . Coronary artery calcification seen on CAT scan 11/09/2016  . Chest discomfort 11/09/2016  . Abdominal distention   . SIRS (systemic inflammatory response syndrome) ([place]) 11/08/2016  . Alcohol withdrawal syndrome without complication ([place])   . COPD exacerbation ([place]) 09/27/2016  . Chronic diastolic heart failure ([place]) 09/27/2016  . HTN (hypertension), benign 09/27/2016  . Acute respiratory failure with hypoxemia ([place]) 09/27/2016    Past  Surgical History:  Procedure Laterality Date  . CYST REMOVAL NECK         Home Medications    Prior to Admission medications   Medication Sig Start Date End Date Taking? Authorizing Provider  albuterol (PROVENTIL HFA;VENTOLIN HFA) 108 (90 Base) MCG/ACT inhaler Inhale 2 puffs into the lungs every 6 (six) hours as needed for wheezing or shortness of breath.    [provider]  ARNUITY ELLIPTA 200 MCG/ACT AEPB Take 1 puff by mouth every morning. 09/14/16   [provider]  aspirin EC 81 MG EC tablet Take 1 tablet (81 mg total) by mouth daily. 11/12/16   Diallo, Earna Coder, MD  folic acid (FOLVITE) 1 MG tablet Take 1 tablet (1 mg total) by mouth daily. 10/01/16   Lavina Hamman, MD  ipratropium-albuterol (DUONEB) 0.5-2.5 (3) MG/3ML SOLN Take 3 mLs by nebulization every 4 (four) hours as needed for wheezing or shortness of breath. 09/07/16   [provider]  losartan (COZAAR) 50 MG tablet Take 50 mg by mouth every morning. 09/15/16   [provider]  nicotine polacrilex (NICORETTE) 2 MG gum Take 1 each (2 mg total) by mouth as needed for smoking cessation. 11/11/16   Diallo, Earna Coder, MD  potassium chloride SA (K-DUR,KLOR-CON) 20 MEQ tablet Take 2 tablets (40 mEq total) by mouth daily. 09/30/16   Lavina Hamman, MD  STIOLTO RESPIMAT 2.5-2.5 MCG/ACT AERS Take 2 puffs by mouth every morning. 09/14/16   [provider]  Tiotropium Bromide-Olodaterol 2.5-2.5 MCG/ACT AERS Inhale 2 puffs into the lungs daily. 06/30/16   [provider]  torsemide (DEMADEX) 20 MG tablet Take 2 tablets (40 mg total)  by mouth daily. 11/11/16   Diallo, Earna Coder, MD  traZODone (DESYREL) 50 MG tablet Take 1 tablet (50 mg total) by mouth at bedtime. 11/11/16   Marjie Skiff, MD    Family History No family history on file.  Social History Social History  Substance Use Topics  . Smoking status: Current Every Day Smoker    Packs/day: 0.25    Types: Cigarettes  . Smokeless  tobacco: Never Used  . Alcohol use 8.4 oz/week    14 Shots of liquor per week     Allergies   Patient has no known allergies.   Review of Systems Review of Systems  All other systems reviewed and are negative.    Physical Exam Updated Vital Signs BP 121/66   Pulse (!) 110   Temp 98.1 F (36.7 C) (Oral)   Resp (!) 22   Ht 1.753 m (5\' 9" )   Wt 89.4 kg (197 lb)   SpO2 99%   BMI 29.09 kg/m   Physical Exam  Constitutional: He is oriented to person, place, and time. He appears well-developed and well-nourished.  HENT:  Head: Normocephalic and atraumatic.  Right Ear: External ear normal.  Left Ear: External ear normal.  Nose: Nose normal.  Mouth/Throat: Oropharynx is clear and moist.  Eyes: Pupils are equal, round, and reactive to light. Conjunctivae and EOM are normal.  Neck: Normal range of motion. Neck supple.  Cardiovascular: Normal rate, regular rhythm, normal heart sounds and intact distal pulses.   Pulmonary/Chest: Effort normal. No respiratory distress. He has no wheezes. He exhibits no tenderness.  Diffuse expiratory wheezes  Abdominal: Soft. Bowel sounds are normal. He exhibits no distension and no mass. There is no tenderness. There is no guarding.  Musculoskeletal: Normal range of motion.  Neurological: He is alert and oriented to person, place, and time. He has normal reflexes. He exhibits normal muscle tone. Coordination normal.  No visual field deficits Extraocular movements intact No palmar drift No signs of cerebellar ataxia on exam  Skin: Skin is warm and dry. Capillary refill takes less than 2 seconds.  Psychiatric: He has a normal mood and affect. His behavior is normal. Judgment and thought content normal.  Nursing note and vitals reviewed.    ED Treatments / Results  Labs (all labs ordered are listed, but only abnormal results are displayed) Labs Reviewed  CBC  COMPREHENSIVE METABOLIC PANEL  I-STAT TROPONIN, ED    EKG  EKG  Interpretation  Date/Time:  Friday December 30 2016 12:27:29 EDT Ventricular Rate:  110 PR Interval:    QRS Duration: 72 QT Interval:  310 QTC Calculation: 420 R Axis:   80 Text Interpretation:  Sinus tachycardia Confirmed by Pattricia Boss [phone]) on 12/30/2016 2:14:48 PM       Radiology No results found.  Procedures Procedures (including critical care time)  Medications Ordered in ED Medications  sodium chloride 0.9 % bolus 500 mL (500 mLs Intravenous Refused 12/30/16 1422)  predniSONE (DELTASONE) tablet 60 mg (60 mg Oral Given 12/30/16 1433)     Initial Impression / Assessment and Plan / ED Course  I have reviewed the triage vital signs and the nursing notes.  Pertinent labs & imaging results that were available during my care of the patient were reviewed by me and considered in my medical decision making (see chart for details).      This is a 71 year old male with severe COPD who became light headed today. Patient states that he has been restricting his water intake  on physician's advice. Creatinine here is elevated from prior of 0.93-1.67 today. He is somewhat tachycardic. Otherwise workup shows no evidence of acute abnormality. We have discussed the need for fluid hydration. He is given 1 L normal saline here. He will increase his by mouth intake over the weekend. He will follow-up with his doctor next week for recheck of creatinine.  Final Clinical Impressions(s) / ED Diagnoses   Final diagnoses:  Dehydration  AKI (acute kidney injury) ([place])    New Prescriptions New Prescriptions   No medications on file     Pattricia Boss, MD 12/30/16 [phone]

## 2017-02-06 ENCOUNTER — Emergency Department (HOSPITAL_COMMUNITY)
Admission: EM | Admit: 2017-02-06 | Discharge: 2017-02-06 | Disposition: A | Discharge status: Home / Self Care | Payer: BLUE CROSS/BLUE SHIELD | Attending: Emergency Medicine | Admitting: Emergency Medicine

## 2017-02-06 ENCOUNTER — Other Ambulatory Visit: Payer: Self-pay

## 2017-02-06 ENCOUNTER — Emergency Department (HOSPITAL_COMMUNITY): Payer: BLUE CROSS/BLUE SHIELD

## 2017-02-06 ENCOUNTER — Encounter (HOSPITAL_COMMUNITY): Payer: Self-pay | Admitting: Emergency Medicine

## 2017-02-06 DIAGNOSIS — Z79899 Other long term (current) drug therapy: Secondary | ICD-10-CM | POA: Insufficient documentation

## 2017-02-06 DIAGNOSIS — F17210 Nicotine dependence, cigarettes, uncomplicated: Secondary | ICD-10-CM | POA: Insufficient documentation

## 2017-02-06 DIAGNOSIS — J441 Chronic obstructive pulmonary disease with (acute) exacerbation: Secondary | ICD-10-CM | POA: Diagnosis not present

## 2017-02-06 DIAGNOSIS — Z7982 Long term (current) use of aspirin: Secondary | ICD-10-CM | POA: Diagnosis not present

## 2017-02-06 DIAGNOSIS — R000 Tachycardia, unspecified: Secondary | ICD-10-CM | POA: Diagnosis not present

## 2017-02-06 DIAGNOSIS — I110 Hypertensive heart disease with heart failure: Secondary | ICD-10-CM | POA: Insufficient documentation

## 2017-02-06 DIAGNOSIS — R079 Chest pain, unspecified: Secondary | ICD-10-CM | POA: Diagnosis present

## 2017-02-06 DIAGNOSIS — I5032 Chronic diastolic (congestive) heart failure: Secondary | ICD-10-CM | POA: Insufficient documentation

## 2017-02-06 LAB — CBC
HCT: 34.5 % — ABNORMAL LOW (ref 39.0–52.0)
HEMOGLOBIN: 11.1 g/dL — AB (ref 13.0–17.0)
MCH: 33.6 pg (ref 26.0–34.0)
MCHC: 32.2 g/dL (ref 30.0–36.0)
MCV: 104.5 fL — ABNORMAL HIGH (ref 78.0–100.0)
Platelets: 167 10*3/uL (ref 150–400)
RBC: 3.3 MIL/uL — ABNORMAL LOW (ref 4.22–5.81)
RDW: 12.8 % (ref 11.5–15.5)
WBC: 7.8 10*3/uL (ref 4.0–10.5)

## 2017-02-06 LAB — I-STAT TROPONIN, ED
Troponin i, poc: 0.02 ng/mL (ref 0.00–0.08)
Troponin i, poc: 0 ng/mL (ref 0.00–0.08)

## 2017-02-06 LAB — BRAIN NATRIURETIC PEPTIDE: B Natriuretic Peptide: 47 pg/mL (ref 0.0–100.0)

## 2017-02-06 LAB — BASIC METABOLIC PANEL
Anion gap: 10 (ref 5–15)
BUN: 20 mg/dL (ref 6–20)
CO2: 35 mmol/L — AB (ref 22–32)
Calcium: 9.2 mg/dL (ref 8.9–10.3)
Chloride: 91 mmol/L — ABNORMAL LOW (ref 101–111)
Creatinine, Ser: 1.46 mg/dL — ABNORMAL HIGH (ref 0.61–1.24)
GFR calc Af Amer: 53 mL/min — ABNORMAL LOW (ref >60)
GFR, EST NON AFRICAN AMERICAN: 46 mL/min — AB (ref >60)
GLUCOSE: 147 mg/dL — AB (ref 65–99)
Potassium: 3.9 mmol/L (ref 3.5–5.1)
Sodium: 136 mmol/L (ref 135–145)

## 2017-02-06 MED ORDER — IPRATROPIUM-ALBUTEROL 0.5-2.5 (3) MG/3ML IN SOLN: 3.0000 mL | RESPIRATORY_TRACT | 0 refills | Status: DC | PRN

## 2017-02-06 MED ORDER — MORPHINE SULFATE (PF) 4 MG/ML IV SOLN: 4.0000 mg | Freq: Once | INTRAVENOUS | Status: DC

## 2017-02-06 MED ORDER — IPRATROPIUM-ALBUTEROL 0.5-2.5 (3) MG/3ML IN SOLN
3.0000 mL | Freq: Once | RESPIRATORY_TRACT | Status: AC
  Administered 2017-02-06: 3 mL via RESPIRATORY_TRACT
  Filled 2017-02-06: qty 3

## 2017-02-06 MED ORDER — METHYLPREDNISOLONE 4 MG PO TBPK: ORAL_TABLET | ORAL | 0 refills | Status: DC

## 2017-02-06 NOTE — ED Triage Notes (Signed)
Pt states he has been having CP and SOB since Friday. Hx CHF. Some swelling in bilateral legs. Pt states pain goes into his shoulders as well. Pt wears 4-5L [place] all the time. Pt very SOB while sitting.

## 2017-02-06 NOTE — ED Provider Notes (Signed)
[facility] DEPT Provider Note   CSN: [id number] Arrival date & time: 02/06/17  1256     History   Chief Complaint Chief Complaint  Patient presents with  . Chest Pain  . Shortness of Breath    HPI Gabriel Stafford is a 71 y.o. male.  HPI Patient, with a past medical history of COPD, CHF, hypertension, presents to ED for evaluation of shortness of breath and chest pain. Regarding the chest pain, he states that he initially felt it 3 days ago in the middle the night. States that it felt like "an ambulance was sitting on my chest." The pain resolved after few minutes on its own. Pain then recurred this morning. He denies any previous history of similar symptoms. He also complains of shortness of breath. He is on 4-5 L of oxygen at home due to his COPD. He does increase his oxygen sometimes up to 8 L with ambulation. He has noticed lower extremity edema since his diagnosis of CHF earlier this year. He reports compliance with his home Lasix. He denies fever, hemoptysis, prior history of MI, DVT or PE, recent surgery, fever, URI symptoms.  Family states that patient drinks half a gallon of liquor every other day and continues to smoke cigarettes daily.  Past Medical History:  Diagnosis Date  . Abdominal distention   . Acute respiratory failure with hypoxemia ([place]) 09/27/2016  . Alcohol withdrawal syndrome without complication ([place])   . Chest discomfort 11/09/2016  . CHF (congestive heart failure) ([place])   . COPD (chronic obstructive pulmonary disease) ([place])   . COPD exacerbation ([place]) 09/27/2016  . Coronary artery calcification seen on CAT scan 11/09/2016  . HTN (hypertension), benign 09/27/2016  . SIRS (systemic inflammatory response syndrome) ([place]) 11/08/2016    Patient Active Problem List   Diagnosis Date Noted  . Coronary artery calcification seen on CAT scan 11/09/2016  . Chest discomfort 11/09/2016  . Abdominal distention   . SIRS (systemic inflammatory response syndrome) ([place])  11/08/2016  . Alcohol withdrawal syndrome without complication ([place])   . COPD exacerbation ([place]) 09/27/2016  . Chronic diastolic heart failure ([place]) 09/27/2016  . HTN (hypertension), benign 09/27/2016  . Acute respiratory failure with hypoxemia ([place]) 09/27/2016    Past Surgical History:  Procedure Laterality Date  . CYST REMOVAL NECK         Home Medications    Prior to Admission medications   Medication Sig Start Date End Date Taking? Authorizing Provider  albuterol (PROVENTIL HFA;VENTOLIN HFA) 108 (90 Base) MCG/ACT inhaler Inhale 2 puffs into the lungs every 6 (six) hours as needed for wheezing or shortness of breath.   Yes [provider]  ARNUITY ELLIPTA 200 MCG/ACT AEPB Take 1 puff by mouth every morning. 09/14/16  Yes [provider]  aspirin EC 81 MG EC tablet Take 1 tablet (81 mg total) by mouth daily. 11/12/16  Yes Diallo, Abdoulaye, MD  cyanocobalamin 500 MCG tablet Take 500 mcg by mouth daily.   Yes [provider]  folic acid (FOLVITE) 1 MG tablet Take 1 tablet (1 mg total) by mouth daily. 10/01/16  Yes Lavina Hamman, MD  losartan (COZAAR) 50 MG tablet Take 50 mg by mouth every morning. 09/15/16  Yes [provider]  potassium chloride SA (K-DUR,KLOR-CON) 20 MEQ tablet Take 2 tablets (40 mEq total) by mouth daily. 09/30/16  Yes Lavina Hamman, MD  STIOLTO RESPIMAT 2.5-2.5 MCG/ACT AERS Take 2 puffs by mouth every morning. 09/14/16  Yes [provider]  torsemide (DEMADEX) 20 MG tablet Take 2 tablets (40 mg total) by mouth daily. Patient taking differently: Take 20-40 mg by mouth See admin instructions. 40mg  in am, 20mg  in pm 11/11/16  Yes Diallo, Abdoulaye, MD  traZODone (DESYREL) 50 MG tablet Take 1 tablet (50 mg total) by mouth at bedtime. 11/11/16  Yes Diallo, Abdoulaye, MD  ipratropium-albuterol (DUONEB) 0.5-2.5 (3) MG/3ML SOLN Take 3 mLs by nebulization every 4 (four) hours as needed. 02/06/17   Jahkai Yandell, PA-C  methylPREDNISolone  (MEDROL DOSEPAK) 4 MG TBPK tablet Taper over 6 days. 02/06/17   Arish Redner, PA-C  nicotine polacrilex (NICORETTE) 2 MG gum Take 1 each (2 mg total) by mouth as needed for smoking cessation. Patient not taking: Reported on 02/06/2017 11/11/16   Marjie Skiff, MD    Family History History reviewed. No pertinent family history.  Social History Social History  Substance Use Topics  . Smoking status: Current Every Day Smoker    Packs/day: 1.00    Types: Cigarettes  . Smokeless tobacco: Never Used  . Alcohol use 8.4 oz/week    14 Shots of liquor per week     Comment: Drinks 1/2 gallon every 2 days     Allergies   Patient has no known allergies.   Review of Systems Review of Systems  Constitutional: Negative for appetite change, chills and fever.  HENT: Negative for ear pain, rhinorrhea, sneezing and sore throat.   Eyes: Negative for photophobia and visual disturbance.  Respiratory: Positive for shortness of breath. Negative for cough, chest tightness and wheezing.   Cardiovascular: Positive for chest pain, palpitations and leg swelling.  Gastrointestinal: Positive for nausea. Negative for abdominal pain, blood in stool, constipation, diarrhea and vomiting.  Genitourinary: Negative for dysuria, hematuria and urgency.  Musculoskeletal: Negative for myalgias.  Skin: Negative for rash.  Neurological: Negative for dizziness, weakness, light-headedness and headaches.     Physical Exam Updated Vital Signs BP 125/78 (BP Location: Left Arm)   Pulse (!) 106   Temp 98.1 F (36.7 C) (Oral)   Resp 17   Ht 5\' 9"  (1.753 m)   Wt 90.7 kg (200 lb)   SpO2 100%   BMI 29.53 kg/m   Physical Exam  Constitutional: He appears well-developed and well-nourished. No distress.  Does appear short of breath. No retractions noted.  HENT:  Head: Normocephalic and atraumatic.  Nose: Nose normal.  Eyes: Conjunctivae and EOM are normal. Right eye exhibits no discharge. Left eye exhibits no  discharge. No scleral icterus.  Neck: Normal range of motion. Neck supple.  Cardiovascular: Regular rhythm, normal heart sounds and intact distal pulses.  Tachycardia present.  Exam reveals no gallop and no friction rub.   No murmur heard. Pulmonary/Chest: Effort normal. No respiratory distress. He has wheezes in the right upper field, the right middle field, the right lower field, the left upper field, the left middle field and the left lower field.  Abdominal: Soft. Bowel sounds are normal. He exhibits no distension. There is no tenderness. There is no guarding.  Musculoskeletal: Normal range of motion. He exhibits no edema.  Neurological: He is alert. He exhibits normal muscle tone. Coordination normal.  Skin: Skin is warm and dry. No rash noted.  Psychiatric: He has a normal mood and affect.  Nursing note and vitals reviewed.    ED Treatments / Results  Labs (all labs ordered are listed, but only abnormal results are displayed) Labs Reviewed  BASIC METABOLIC PANEL - Abnormal; Notable for the following:  Result Value   Chloride 91 (*)    CO2 35 (*)    Glucose, Bld 147 (*)    Creatinine, Ser 1.46 (*)    GFR calc non Af Amer 46 (*)    GFR calc Af Amer 53 (*)    All other components within normal limits  CBC - Abnormal; Notable for the following:    RBC 3.30 (*)    Hemoglobin 11.1 (*)    HCT 34.5 (*)    MCV 104.5 (*)    All other components within normal limits  BRAIN NATRIURETIC PEPTIDE  I-STAT TROPONIN, ED  I-STAT TROPONIN, ED    EKG  EKG Interpretation None       Radiology Dg Chest 2 View  Result Date: 02/06/2017 CLINICAL DATA:  Pt states he has been having CP and SOB since Friday. Hx CHF. Some swelling in bilateral legs. Pt states pain goes into his shoulders as well. Pt very SOB while sitting. EXAM: CHEST - 2 VIEW COMPARISON:  12/30/2016 FINDINGS: Relatively low lung volumes.  Lungs are clear. Heart size and mediastinal contours are within normal limits.  Atheromatous aorta. No effusion.  No pneumothorax. Anterior vertebral endplate spurring at multiple levels in the mid and lower thoracic spine. IMPRESSION: No acute cardiopulmonary disease. Electronically Signed   By: Lucrezia Europe M.D.   On: 02/06/2017 14:17    Procedures Procedures (including critical care time)  Medications Ordered in ED Medications  morphine 4 MG/ML injection 4 mg (0 mg Intravenous Hold 02/06/17 1414)  ipratropium-albuterol (DUONEB) 0.5-2.5 (3) MG/3ML nebulizer solution 3 mL (3 mLs Nebulization Given 02/06/17 1514)     Initial Impression / Assessment and Plan / ED Course  I have reviewed the triage vital signs and the nursing notes.  Pertinent labs & imaging results that were available during my care of the patient were reviewed by me and considered in my medical decision making (see chart for details).     Patient presents to ED for evaluation of chest pain, shortness of breath. He does have extensive past medical history including CHF, COPD on chronic oxygen therapy. Patient was tachycardic to 120s on initial evaluation. He did appear short of breath while sitting on bed. He is wheezing throughout bilateral lung fields. Troponin negative 1. EKG shows sinus tachycardia. BMP unremarkable. CBC unremarkable. BMP with elevated creatinine. Patient reports improvement in symptoms with DuoNeb treatment. Heart rate has improved but still tachycardic. He is afebrile with no history of fever. I spoke to patient regarding getting admitted for what appears to be a COPD exacerbation and cardiac consult. However after a lengthy discussion, patient states that he does not want to be admitted. He feels as though he has body cannot take going through more lab work or medications. He is aware of any return precautions including worsening of chest pain or trouble breathing. He states that he will follow-up with his cardiologist and PCP. However he continues to state that he does not want to be  admitted because "I don't think my body will get any better." His main concern was to make sure he was not having a heart attack. I believe he has full decision making capacity. I would prefer if he was admitted for further management and cardiology consult given what appears to be anginal chest pain. For this reason I will discharge AMA and will give refills for DuoNeb and steroids taper.  Patient discussed with and seen by Dr. Tomi Bamberger.  Final Clinical Impressions(s) / ED Diagnoses  Final diagnoses:  COPD exacerbation ([place])    New Prescriptions New Prescriptions   IPRATROPIUM-ALBUTEROL (DUONEB) 0.5-2.5 (3) MG/3ML SOLN    Take 3 mLs by nebulization every 4 (four) hours as needed.   METHYLPREDNISOLONE (MEDROL DOSEPAK) 4 MG TBPK TABLET    Taper over 6 days.     Delia Heady, PA-C 02/06/17 1642    Delia Heady, PA-C 02/06/17 1851    Dorie Rank, MD 02/08/17 1130

## 2017-03-06 ENCOUNTER — Encounter (HOSPITAL_COMMUNITY): Payer: Self-pay | Admitting: Emergency Medicine

## 2017-03-06 ENCOUNTER — Observation Stay (HOSPITAL_COMMUNITY)
Admission: EM | Admit: 2017-03-06 | Discharge: 2017-03-08 | DRG: 191 | Disposition: A | Discharge status: Hospice | Payer: BLUE CROSS/BLUE SHIELD | Attending: Oncology | Admitting: Oncology

## 2017-03-06 ENCOUNTER — Emergency Department (HOSPITAL_COMMUNITY): Payer: BLUE CROSS/BLUE SHIELD

## 2017-03-06 DIAGNOSIS — Z7982 Long term (current) use of aspirin: Secondary | ICD-10-CM | POA: Diagnosis not present

## 2017-03-06 DIAGNOSIS — I5032 Chronic diastolic (congestive) heart failure: Secondary | ICD-10-CM | POA: Diagnosis present

## 2017-03-06 DIAGNOSIS — R069 Unspecified abnormalities of breathing: Secondary | ICD-10-CM | POA: Diagnosis not present

## 2017-03-06 DIAGNOSIS — F17210 Nicotine dependence, cigarettes, uncomplicated: Secondary | ICD-10-CM | POA: Diagnosis present

## 2017-03-06 DIAGNOSIS — Z8249 Family history of ischemic heart disease and other diseases of the circulatory system: Secondary | ICD-10-CM

## 2017-03-06 DIAGNOSIS — F1010 Alcohol abuse, uncomplicated: Secondary | ICD-10-CM | POA: Diagnosis present

## 2017-03-06 DIAGNOSIS — I248 Other forms of acute ischemic heart disease: Secondary | ICD-10-CM

## 2017-03-06 DIAGNOSIS — I493 Ventricular premature depolarization: Secondary | ICD-10-CM | POA: Diagnosis present

## 2017-03-06 DIAGNOSIS — Z6829 Body mass index (BMI) 29.0-29.9, adult: Secondary | ICD-10-CM

## 2017-03-06 DIAGNOSIS — I110 Hypertensive heart disease with heart failure: Secondary | ICD-10-CM | POA: Diagnosis present

## 2017-03-06 DIAGNOSIS — I2510 Atherosclerotic heart disease of native coronary artery without angina pectoris: Secondary | ICD-10-CM | POA: Diagnosis present

## 2017-03-06 DIAGNOSIS — R079 Chest pain, unspecified: Secondary | ICD-10-CM | POA: Diagnosis not present

## 2017-03-06 DIAGNOSIS — N179 Acute kidney failure, unspecified: Secondary | ICD-10-CM | POA: Diagnosis present

## 2017-03-06 DIAGNOSIS — J449 Chronic obstructive pulmonary disease, unspecified: Secondary | ICD-10-CM

## 2017-03-06 DIAGNOSIS — I130 Hypertensive heart and chronic kidney disease with heart failure and stage 1 through stage 4 chronic kidney disease, or unspecified chronic kidney disease: Secondary | ICD-10-CM | POA: Diagnosis present

## 2017-03-06 DIAGNOSIS — Z66 Do not resuscitate: Secondary | ICD-10-CM | POA: Diagnosis present

## 2017-03-06 DIAGNOSIS — Z9981 Dependence on supplemental oxygen: Secondary | ICD-10-CM

## 2017-03-06 DIAGNOSIS — D696 Thrombocytopenia, unspecified: Secondary | ICD-10-CM | POA: Diagnosis present

## 2017-03-06 DIAGNOSIS — R0602 Shortness of breath: Secondary | ICD-10-CM | POA: Diagnosis present

## 2017-03-06 DIAGNOSIS — E669 Obesity, unspecified: Secondary | ICD-10-CM | POA: Diagnosis present

## 2017-03-06 DIAGNOSIS — D539 Nutritional anemia, unspecified: Secondary | ICD-10-CM | POA: Diagnosis present

## 2017-03-06 DIAGNOSIS — N183 Chronic kidney disease, stage 3 (moderate): Secondary | ICD-10-CM | POA: Diagnosis present

## 2017-03-06 DIAGNOSIS — J441 Chronic obstructive pulmonary disease with (acute) exacerbation: Secondary | ICD-10-CM | POA: Diagnosis not present

## 2017-03-06 LAB — CBC
HEMATOCRIT: 32.1 % — AB (ref 39.0–52.0)
HEMATOCRIT: 31.9 % — AB (ref 39.0–52.0)
HEMOGLOBIN: 10.8 g/dL — AB (ref 13.0–17.0)
Hemoglobin: 10.5 g/dL — ABNORMAL LOW (ref 13.0–17.0)
MCH: 34.3 pg — ABNORMAL HIGH (ref 26.0–34.0)
MCH: 33.8 pg (ref 26.0–34.0)
MCHC: 33.6 g/dL (ref 30.0–36.0)
MCHC: 32.9 g/dL (ref 30.0–36.0)
MCV: 101.9 fL — ABNORMAL HIGH (ref 78.0–100.0)
MCV: 102.6 fL — AB (ref 78.0–100.0)
Platelets: 136 10*3/uL — ABNORMAL LOW (ref 150–400)
Platelets: 136 10*3/uL — ABNORMAL LOW (ref 150–400)
RBC: 3.15 MIL/uL — ABNORMAL LOW (ref 4.22–5.81)
RBC: 3.11 MIL/uL — AB (ref 4.22–5.81)
RDW: 12.8 % (ref 11.5–15.5)
RDW: 12.8 % (ref 11.5–15.5)
WBC: 10.4 10*3/uL (ref 4.0–10.5)
WBC: 8.7 10*3/uL (ref 4.0–10.5)

## 2017-03-06 LAB — BASIC METABOLIC PANEL
ANION GAP: 17 — AB (ref 5–15)
BUN: 48 mg/dL — ABNORMAL HIGH (ref 6–20)
CALCIUM: 9.6 mg/dL (ref 8.9–10.3)
CO2: 26 mmol/L (ref 22–32)
Chloride: 93 mmol/L — ABNORMAL LOW (ref 101–111)
Creatinine, Ser: 1.99 mg/dL — ABNORMAL HIGH (ref 0.61–1.24)
GFR, EST AFRICAN AMERICAN: 37 mL/min — AB (ref >60)
GFR, EST NON AFRICAN AMERICAN: 32 mL/min — AB (ref >60)
GLUCOSE: 146 mg/dL — AB (ref 65–99)
POTASSIUM: 4.5 mmol/L (ref 3.5–5.1)
SODIUM: 136 mmol/L (ref 135–145)

## 2017-03-06 LAB — TROPONIN I
TROPONIN I: 0.04 ng/mL — AB (ref <0.03)
TROPONIN I: 0.04 ng/mL — AB (ref <0.03)

## 2017-03-06 LAB — I-STAT TROPONIN, ED: TROPONIN I, POC: 0.06 ng/mL (ref 0.00–0.08)

## 2017-03-06 LAB — CREATININE, SERUM
Creatinine, Ser: 2.16 mg/dL — ABNORMAL HIGH (ref 0.61–1.24)
GFR, EST AFRICAN AMERICAN: 33 mL/min — AB (ref >60)
GFR, EST NON AFRICAN AMERICAN: 29 mL/min — AB (ref >60)

## 2017-03-06 LAB — INFLUENZA PANEL BY PCR (TYPE A & B)
INFLAPCR: NEGATIVE
Influenza B By PCR: NEGATIVE

## 2017-03-06 LAB — HEMOGLOBIN A1C
Hgb A1c MFr Bld: 5.5 % (ref 4.8–5.6)
Mean Plasma Glucose: 111.15 mg/dL

## 2017-03-06 LAB — BRAIN NATRIURETIC PEPTIDE: B NATRIURETIC PEPTIDE 5: 89.2 pg/mL (ref 0.0–100.0)

## 2017-03-06 MED ORDER — ASPIRIN EC 81 MG PO TBEC
81.0000 mg | DELAYED_RELEASE_TABLET | Freq: Every day | ORAL | Status: DC
  Administered 2017-03-07 – 2017-03-08 (×2): 81 mg via ORAL
  Filled 2017-03-06 (×2): qty 1

## 2017-03-06 MED ORDER — ONDANSETRON HCL 4 MG PO TABS: 4.0000 mg | ORAL_TABLET | Freq: Four times a day (QID) | ORAL | Status: DC | PRN

## 2017-03-06 MED ORDER — ADULT MULTIVITAMIN W/MINERALS CH
1.0000 | ORAL_TABLET | Freq: Every day | ORAL | Status: DC
  Administered 2017-03-06 – 2017-03-08 (×3): 1 via ORAL
  Filled 2017-03-06 (×3): qty 1

## 2017-03-06 MED ORDER — MORPHINE SULFATE (PF) 4 MG/ML IV SOLN: 2.0000 mg | Freq: Once | INTRAVENOUS | Status: DC

## 2017-03-06 MED ORDER — ALBUTEROL (5 MG/ML) CONTINUOUS INHALATION SOLN
10.0000 mg/h | INHALATION_SOLUTION | RESPIRATORY_TRACT | Status: DC
  Administered 2017-03-06: 10 mg/h via RESPIRATORY_TRACT
  Filled 2017-03-06: qty 20

## 2017-03-06 MED ORDER — VITAMIN B-12 1000 MCG PO TABS
500.0000 ug | ORAL_TABLET | Freq: Every day | ORAL | Status: DC
  Administered 2017-03-07 – 2017-03-08 (×2): 500 ug via ORAL
  Filled 2017-03-06 (×2): qty 1

## 2017-03-06 MED ORDER — CEFTRIAXONE SODIUM 1 G IJ SOLR
1.0000 g | INTRAMUSCULAR | Status: DC
  Administered 2017-03-07 (×2): 1 g via INTRAVENOUS
  Filled 2017-03-06 (×2): qty 10

## 2017-03-06 MED ORDER — LEVALBUTEROL HCL 1.25 MG/0.5ML IN NEBU
1.2500 mg | INHALATION_SOLUTION | Freq: Four times a day (QID) | RESPIRATORY_TRACT | Status: DC
  Administered 2017-03-06 – 2017-03-07 (×3): 1.25 mg via RESPIRATORY_TRACT
  Filled 2017-03-06 (×3): qty 0.5

## 2017-03-06 MED ORDER — THIAMINE HCL 100 MG/ML IJ SOLN: 100.0000 mg | Freq: Every day | INTRAMUSCULAR | Status: DC

## 2017-03-06 MED ORDER — SODIUM CHLORIDE 0.9 % IV BOLUS (SEPSIS)
500.0000 mL | Freq: Once | INTRAVENOUS | Status: AC
  Administered 2017-03-06: 500 mL via INTRAVENOUS

## 2017-03-06 MED ORDER — FOLIC ACID 1 MG PO TABS
1.0000 mg | ORAL_TABLET | Freq: Every day | ORAL | Status: DC
Start: 2017-03-06 — End: 2017-03-08
  Administered 2017-03-06 – 2017-03-08 (×3): 1 mg via ORAL
  Filled 2017-03-06 (×3): qty 1

## 2017-03-06 MED ORDER — HEPARIN SODIUM (PORCINE) 5000 UNIT/ML IJ SOLN
5000.0000 [IU] | Freq: Three times a day (TID) | INTRAMUSCULAR | Status: DC
  Administered 2017-03-06 – 2017-03-08 (×5): 5000 [IU] via SUBCUTANEOUS
  Filled 2017-03-06 (×5): qty 1

## 2017-03-06 MED ORDER — ONDANSETRON 4 MG PO TBDP
4.0000 mg | ORAL_TABLET | Freq: Once | ORAL | Status: AC
  Administered 2017-03-06: 4 mg via ORAL
  Filled 2017-03-06: qty 1

## 2017-03-06 MED ORDER — PREDNISONE 20 MG PO TABS
40.0000 mg | ORAL_TABLET | Freq: Every day | ORAL | Status: DC
  Administered 2017-03-07 – 2017-03-08 (×2): 40 mg via ORAL
  Filled 2017-03-06 (×2): qty 2

## 2017-03-06 MED ORDER — VITAMIN B-1 100 MG PO TABS
100.0000 mg | ORAL_TABLET | Freq: Every day | ORAL | Status: DC
  Administered 2017-03-06 – 2017-03-08 (×3): 100 mg via ORAL
  Filled 2017-03-06 (×3): qty 1

## 2017-03-06 MED ORDER — ASPIRIN 81 MG PO TBEC: 81.0000 mg | DELAYED_RELEASE_TABLET | Freq: Every day | ORAL | Status: DC

## 2017-03-06 MED ORDER — IPRATROPIUM-ALBUTEROL 0.5-2.5 (3) MG/3ML IN SOLN
3.0000 mL | RESPIRATORY_TRACT | Status: DC
  Administered 2017-03-06 (×2): 3 mL via RESPIRATORY_TRACT
  Filled 2017-03-06 (×2): qty 3

## 2017-03-06 MED ORDER — ASPIRIN 81 MG PO CHEW
324.0000 mg | CHEWABLE_TABLET | Freq: Once | ORAL | Status: DC
  Filled 2017-03-06: qty 4

## 2017-03-06 MED ORDER — IPRATROPIUM-ALBUTEROL 0.5-2.5 (3) MG/3ML IN SOLN
3.0000 mL | Freq: Once | RESPIRATORY_TRACT | Status: AC
  Administered 2017-03-06: 3 mL via RESPIRATORY_TRACT
  Filled 2017-03-06: qty 3

## 2017-03-06 MED ORDER — IPRATROPIUM BROMIDE 0.02 % IN SOLN
0.5000 mg | RESPIRATORY_TRACT | Status: DC
  Administered 2017-03-06 – 2017-03-08 (×10): 0.5 mg via RESPIRATORY_TRACT
  Filled 2017-03-06 (×10): qty 2.5

## 2017-03-06 MED ORDER — ASPIRIN EC 325 MG PO TBEC: 325.0000 mg | DELAYED_RELEASE_TABLET | Freq: Once | ORAL | Status: DC

## 2017-03-06 MED ORDER — METHYLPREDNISOLONE SODIUM SUCC 125 MG IJ SOLR
80.0000 mg | Freq: Once | INTRAMUSCULAR | Status: AC
  Administered 2017-03-06: 80 mg via INTRAVENOUS
  Filled 2017-03-06: qty 2

## 2017-03-06 NOTE — ED Notes (Signed)
EDP at bedside  

## 2017-03-06 NOTE — ED Triage Notes (Signed)
EMS reports the pt called 9-1-1 for SOB secondary to COPD. EMS reports the pt has diminished lung sounds with rhonchi and a productive cough with clear sputum. EMS reports the pt also reported nausea and vomiting but was not concerned with that.   Pt states he woke up in a cold sweat, started to get worried, and then had increased SOB. Pt states he is on home oxygen but has to increase the amount of oxygen to breathe.

## 2017-03-06 NOTE — ED Provider Notes (Signed)
[facility] DEPT Provider Note   CSN: [id number] Arrival date & time: 03/06/17  4098     History   Chief Complaint Chief Complaint  Patient presents with  . Shortness of Breath    HPI Gabriel Stafford is a 71 y.o. male with history of COPD, CHF who presents with acute onset shortness of breath associated with diaphoresis, nausea, and dry heaving this morning. Patient reports an increased oxygen requirement. He reports he is much more short of breath than usual, even for his COPD. He has had some associated chest pressure that is nonradiating. Patient was here last month and was recommended admission by the attending for cardiac workup, however he left AGAINST MEDICAL ADVICE and will follow-up with his cardiologist. Patient reports using albuterol treatment prior to arrival that did not help very much. He also reports taking 4 aspirin PTA. He reports he has been coughing since yesterday with productive sputum. He denies any abdominal pain, as fever, urinary symptoms.  HPI  Past Medical History:  Diagnosis Date  . Abdominal distention   . Acute respiratory failure with hypoxemia ([place]) 09/27/2016  . Alcohol withdrawal syndrome without complication ([place])   . Chest discomfort 11/09/2016  . CHF (congestive heart failure) ([place])   . COPD (chronic obstructive pulmonary disease) ([place])   . COPD exacerbation ([place]) 09/27/2016  . Coronary artery calcification seen on CAT scan 11/09/2016  . HTN (hypertension), benign 09/27/2016  . SIRS (systemic inflammatory response syndrome) ([place]) 11/08/2016    Patient Active Problem List   Diagnosis Date Noted  . Coronary artery calcification seen on CAT scan 11/09/2016  . Chest discomfort 11/09/2016  . Abdominal distention   . SIRS (systemic inflammatory response syndrome) ([place]) 11/08/2016  . Alcohol withdrawal syndrome without complication ([place])   . COPD exacerbation ([place]) 09/27/2016  . Chronic diastolic heart failure ([place]) 09/27/2016  . HTN (hypertension),  benign 09/27/2016  . Acute respiratory failure with hypoxemia ([place]) 09/27/2016    Past Surgical History:  Procedure Laterality Date  . CYST REMOVAL NECK         Home Medications    Prior to Admission medications   Medication Sig Start Date End Date Taking? Authorizing Provider  albuterol (PROVENTIL HFA;VENTOLIN HFA) 108 (90 Base) MCG/ACT inhaler Inhale 2 puffs into the lungs every 6 (six) hours as needed for wheezing or shortness of breath.   Yes [provider]  ARNUITY ELLIPTA 200 MCG/ACT AEPB Take 1 puff by mouth every morning. 09/14/16  Yes [provider]  aspirin EC 81 MG EC tablet Take 1 tablet (81 mg total) by mouth daily. 11/12/16  Yes Diallo, Abdoulaye, MD  cyanocobalamin 500 MCG tablet Take 500 mcg by mouth daily.   Yes [provider]  folic acid (FOLVITE) 1 MG tablet Take 1 tablet (1 mg total) by mouth daily. 10/01/16  Yes Lavina Hamman, MD  ipratropium-albuterol (DUONEB) 0.5-2.5 (3) MG/3ML SOLN Take 3 mLs by nebulization every 4 (four) hours as needed. Patient taking differently: Take 3 mLs by nebulization every 4 (four) hours as needed (shortness of breath).  02/06/17  Yes Khatri, Hina, PA-C  losartan (COZAAR) 50 MG tablet Take 50 mg by mouth every morning. 09/15/16  Yes [provider]  potassium chloride SA (K-DUR,KLOR-CON) 20 MEQ tablet Take 2 tablets (40 mEq total) by mouth daily. 09/30/16  Yes Lavina Hamman, MD  STIOLTO RESPIMAT 2.5-2.5 MCG/ACT AERS Take 2 puffs by mouth every morning. 09/14/16  Yes [provider]  torsemide (DEMADEX) 20 MG tablet  Take 2 tablets (40 mg total) by mouth daily. Patient taking differently: Take 20-40 mg by mouth See admin instructions. 40mg  in am, 20mg  in pm 11/11/16  Yes Diallo, Abdoulaye, MD  traZODone (DESYREL) 50 MG tablet Take 1 tablet (50 mg total) by mouth at bedtime. 11/11/16  Yes Diallo, Earna Coder, MD    Family History No family history on file.  Social History Social History  Substance  Use Topics  . Smoking status: Current Every Day Smoker    Packs/day: 1.00    Types: Cigarettes  . Smokeless tobacco: Never Used  . Alcohol use 8.4 oz/week    14 Shots of liquor per week     Comment: Drinks 1/2 gallon every 2 days     Allergies   Patient has no known allergies.   Review of Systems Review of Systems  Constitutional: Positive for diaphoresis. Negative for chills and fever.  HENT: Negative for facial swelling and sore throat.   Respiratory: Positive for cough and shortness of breath.   Cardiovascular: Positive for chest pain.  Gastrointestinal: Positive for nausea and vomiting (dry heaves). Negative for abdominal pain.  Genitourinary: Negative for dysuria.  Musculoskeletal: Negative for back pain.  Skin: Negative for rash and wound.  Neurological: Negative for headaches.  Psychiatric/Behavioral: The patient is not nervous/anxious.      Physical Exam Updated Vital Signs BP 132/69   Pulse (!) 121   Temp 98.8 F (37.1 C)   Resp (!) 26   Ht 5\' 9"  (1.753 m)   Wt 90.7 kg (200 lb)   SpO2 99%   BMI 29.53 kg/m   Physical Exam  Constitutional: He appears well-developed and well-nourished. No distress.  HENT:  Head: Normocephalic and atraumatic.  Mouth/Throat: Oropharynx is clear and moist. No oropharyngeal exudate.  Eyes: Pupils are equal, round, and reactive to light. Conjunctivae are normal. Right eye exhibits no discharge. Left eye exhibits no discharge. No scleral icterus.  Neck: Normal range of motion. Neck supple. No thyromegaly present.  Cardiovascular: Normal rate, regular rhythm, normal heart sounds and intact distal pulses.  Exam reveals no gallop and no friction rub.   No murmur heard. Pulmonary/Chest: Effort normal. No stridor. No respiratory distress. He has decreased breath sounds. He has wheezes (expiratory throughout). He has rhonchi. He has no rales.  Abdominal: Soft. Bowel sounds are normal. He exhibits no distension. There is no tenderness.  There is no rebound and no guarding.  Musculoskeletal: He exhibits no edema.  Lymphadenopathy:    He has no cervical adenopathy.  Neurological: He is alert. Coordination normal.  Skin: Skin is warm and dry. No rash noted. He is not diaphoretic. No pallor.  Psychiatric: He has a normal mood and affect.  Nursing note and vitals reviewed.    ED Treatments / Results  Labs (all labs ordered are listed, but only abnormal results are displayed) Labs Reviewed  BASIC METABOLIC PANEL - Abnormal; Notable for the following:       Result Value   Chloride 93 (*)    Glucose, Bld 146 (*)    BUN 48 (*)    Creatinine, Ser 1.99 (*)    GFR calc non Af Amer 32 (*)    GFR calc Af Amer 37 (*)    Anion gap 17 (*)    All other components within normal limits  CBC - Abnormal; Notable for the following:    RBC 3.15 (*)    Hemoglobin 10.8 (*)    HCT 32.1 (*)  MCV 101.9 (*)    MCH 34.3 (*)    Platelets 136 (*)    All other components within normal limits  BRAIN NATRIURETIC PEPTIDE  I-STAT TROPONIN, ED    EKG  EKG Interpretation  Date/Time:  Monday March 06 2017 08:00:33 EDT Ventricular Rate:  127 PR Interval:    QRS Duration: 77 QT Interval:  289 QTC Calculation: 420 R Axis:   77 Text Interpretation:  Sinus tachycardia Multiple ventricular premature complexes Artifact Abnormal ekg Confirmed by Carmin Muskrat [phone]) on 03/06/2017 8:24:35 AM       Radiology Dg Chest 2 View  Result Date: 03/06/2017 CLINICAL DATA:  Shortness of breath. EXAM: CHEST  2 VIEW COMPARISON:  Radiographs of February 06, 2017. FINDINGS: The heart size and mediastinal contours are within normal limits. Atherosclerosis of thoracic aorta is noted. No pneumothorax or pleural effusion is noted. Both lungs are clear. The visualized skeletal structures are unremarkable. IMPRESSION: No active cardiopulmonary disease.  Aortic atherosclerosis. Electronically Signed   By: Marijo Conception, M.D.   On: 03/06/2017 07:25     Procedures Procedures (including critical care time)  Medications Ordered in ED Medications  albuterol (PROVENTIL,VENTOLIN) solution continuous neb (not administered)  ipratropium-albuterol (DUONEB) 0.5-2.5 (3) MG/3ML nebulizer solution 3 mL (3 mLs Nebulization Given 03/06/17 0757)  ondansetron (ZOFRAN-ODT) disintegrating tablet 4 mg (4 mg Oral Given 03/06/17 0832)  methylPREDNISolone sodium succinate (SOLU-MEDROL) 125 mg/2 mL injection 80 mg (80 mg Intravenous Given 03/06/17 0918)  sodium chloride 0.9 % bolus 500 mL (500 mLs Intravenous New Bag/Given 03/06/17 0935)     Initial Impression / Assessment and Plan / ED Course  I have reviewed the triage vital signs and the nursing notes.  Pertinent labs & imaging results that were available during my care of the patient were reviewed by me and considered in my medical decision making (see chart for details).     Patient with COPD exacerbation as well as concern for ACS. Initial troponin 0.06. Patient also with AKI, BUN 48, creatinine 1.99. 500cc fluid bolus carefully initiated due to CHF. Solu-medrol initiated in ED. BNP 89.2. Patient given DuoNeb treatment here without relief. Patient continues to have wheezing rhonchi. Continuous albuterol treatment ordered. I spoke with Dr. Marlowe Sax with the internal medicine teaching service who will admit the patient for further evaluation and treatment. Patient also evaluated by Dr. Vanita Panda who guided the patient's management and agrees with plan.  Final Clinical Impressions(s) / ED Diagnoses   Final diagnoses:  COPD exacerbation ([place])  Chest pain, unspecified type    New Prescriptions New Prescriptions   No medications on file     Caryl Ada 03/06/17 3818    Carmin Muskrat, MD 03/06/17 1556

## 2017-03-06 NOTE — ED Notes (Signed)
Admitting aware of troponin  

## 2017-03-06 NOTE — ED Notes (Signed)
Patient transported to X-ray 

## 2017-03-06 NOTE — ED Notes (Signed)
Patient complaining of 4/10 left chest pain. EKG recaptured and edp aware

## 2017-03-06 NOTE — H&P (Signed)
Date: 03/06/2017               Patient Name:  Gabriel Stafford MRN: [id number]  DOB: 1946/05/03 Age / Sex: 71 y.o., male   PCP: System, Pcp Not In         Medical Service: Internal Medicine Teaching Service         Attending Physician: Dr. Beryle Beams, Alyson Locket, MD    First Contact: Dr. Shan Levans Pager: [phone]  Second Contact: Dr. Jari Favre Pager: [phone]       After Hours (After 5p/  First Contact Pager: [phone]  weekends / holidays): Second Contact Pager: [phone]   Chief Complaint: SOB  History of Present Illness: Patient is a 71 year old male with a past medical history of hypertension, COPD, CHF presenting to the hospital via EMS for acute onset shortness of breath. Patient states he has not been sleeping well or eating well for the past few days. States he woke up last night short of breath, sweating, and his chest was "pounding." Also reports experiencing left-sided chest pain that radiated to both of his shoulders at that time. He describes the chest pain as "someone parked a big truck on me" and states it was 12 out of 10 in intensity. Chest pain resolved in 5 minutes. Denies having any chest pain at present. States he took a breathing treatment at home but it did not help and that is when he decided to seek medical attention. Reports taking 4 aspirins prior to arrival. Patient has GOLD stage IV COPD for which he is being followed by pulmonology at [facility]. He chronically uses 4-6 L of oxygen at home at all times. Reports having dyspnea with minimal exertion at home for the past few weeks but his oxygen requirement has not changed. Reports having a cough productive of yellow sputum this morning. Denies having any cough or sputum production in the preceding days. Also reports having dry heaves for the past few days; no episodes of vomiting. Patient reports having an episode of substernal severe chest pain that woke him up from his sleep about a week ago. He denies having any fevers,  orthopnea, pleuritic chest pain, or calf pain/swelling. No recent travel.  PCP: Charlott Holler, NP at [facility] in [facility]  Upon arrival to the ED, patient was found to be tachycardic and tachypneic. Noted to be satting 96-100% on 4 L oxygen via nasal cannula. Troponin 0.06. BNP 89.2. BUN 48 and creatinine 1.9.  Meds:  Current Meds  Medication Sig  . albuterol (PROVENTIL HFA;VENTOLIN HFA) 108 (90 Base) MCG/ACT inhaler Inhale 2 puffs into the lungs every 6 (six) hours as needed for wheezing or shortness of breath.  . ARNUITY ELLIPTA 200 MCG/ACT AEPB Take 1 puff by mouth every morning.  Marland Kitchen aspirin EC 81 MG EC tablet Take 1 tablet (81 mg total) by mouth daily.  . cyanocobalamin 500 MCG tablet Take 500 mcg by mouth daily.  . folic acid (FOLVITE) 1 MG tablet Take 1 tablet (1 mg total) by mouth daily.  Marland Kitchen ipratropium-albuterol (DUONEB) 0.5-2.5 (3) MG/3ML SOLN Take 3 mLs by nebulization every 4 (four) hours as needed. (Patient taking differently: Take 3 mLs by nebulization every 4 (four) hours as needed (shortness of breath). )  . losartan (COZAAR) 50 MG tablet Take 50 mg by mouth every morning.  . potassium chloride SA (K-DUR,KLOR-CON) 20 MEQ tablet Take 2 tablets (40 mEq total) by mouth daily.  Marland Kitchen STIOLTO RESPIMAT 2.5-2.5 MCG/ACT  AERS Take 2 puffs by mouth every morning.  . torsemide (DEMADEX) 20 MG tablet Take 2 tablets (40 mg total) by mouth daily. (Patient taking differently: Take 20-40 mg by mouth See admin instructions. 40mg  in am, 20mg  in pm)  . traZODone (DESYREL) 50 MG tablet Take 1 tablet (50 mg total) by mouth at bedtime.     Allergies: Allergies as of 03/06/2017  . (No Known Allergies)   Past Medical History:  Diagnosis Date  . Abdominal distention   . Acute respiratory failure with hypoxemia ([place]) 09/27/2016  . Alcohol withdrawal syndrome without complication ([place])   . Chest discomfort 11/09/2016  . CHF (congestive heart failure) ([place])   . COPD  (chronic obstructive pulmonary disease) ([place])   . COPD exacerbation ([place]) 09/27/2016  . Coronary artery calcification seen on CAT scan 11/09/2016  . HTN (hypertension), benign 09/27/2016  . SIRS (systemic inflammatory response syndrome) ([place]) 11/08/2016    Family History: Mother had coronary artery disease and died at age 36. Father had an MI and died at age 61. Brother had coronary artery disease and died at age 38. Sister had "heart problems"and died at age 44.  Social History: Currently smoking 1 pack of cigarettes per week; was previously smoking 1 pack per day for 50-55 years. Drinks 2 shots of whiskey per day. Denies any illicit drug use.  Review of Systems: A complete ROS was negative except as per HPI.   Physical Exam: Blood pressure 137/76, pulse (!) 122, temperature 98.4 F (36.9 C), temperature source Oral, resp. rate (!) 24, height 5\' 9"  (1.753 m), weight 200 lb (90.7 kg), SpO2 100 %. Physical Exam  Constitutional: He is oriented to person, place, and time. He appears well-developed and well-nourished.  Mildly distressed  HENT:  Head: Normocephalic and atraumatic.  Dry mucous membranes  Eyes: Right eye exhibits no discharge. Left eye exhibits no discharge.  Neck: Neck supple. No tracheal deviation present.  Cardiovascular: Normal rate, regular rhythm and intact distal pulses.   Pulmonary/Chest: He has no rales.  Coarse breath sounds and diffuse end expiratory wheezing.  Abdominal: Soft. Bowel sounds are normal. He exhibits no distension. There is no tenderness.  Musculoskeletal:  Trace pedal edema  Neurological: He is alert and oriented to person, place, and time.  Skin: Skin is warm and dry.  Psychiatric: He has a normal mood and affect. His behavior is normal.    EKG: personally reviewed my interpretation is sinus tachycardia with heart rate 127 and PVCs.  CXR: personally reviewed my interpretation is no acute cardiopulmonary disease.  Assessment & Plan by  Problem: Active Problems:   COPD exacerbation (HCC)  Acute COPD exacerbation: Patient has a history of GOLD stage IV COPD and bronchiectasis. He is chronically on 4-6 L of home oxygen at all times. He is being followed by pulmonology at [facility]. He is now presenting with worsening dyspnea for the past few weeks and was acutely short of breath last night. Noted to have coarse breath sounds and diffuse end expiratory wheezing on exam. He did report having a cough productive of yellow sputum that started this morning, however, no cough or sputum production in the preceding days. Will hold antibiotics at this time. Dyspnea less likely to be secondary to CHF as he does not appear to be grossly volume overloaded on exam. PE less likely as he does not have any pleuritic chest pain, calf pain/swelling, or history of recent travel. -Admit to telemetry -Supplemental oxygen. Keep oxygen saturation between  88-92%. -DuoNeb every 4 hours -He has already received Solu-Medrol 80 mg IV once in the ED -Influenza panel pending -Continue discuss tobacco cessation -Pulmonology follow-up as outpatient  Angina: Patient has experienced 2 episodes of severe substernal chest pain at rest in the past 1 week. Chest pain last night was accompanied by shortness of breath and diaphoresis. Point-of-care Troponin 0.06 on admission. EKG not suggestive of any acute ischemic changes. Although he does have significant risk factors for coronary artery disease including strong family history, hypertension, age, gender, and tobacco use. Noted to have coronary artery atherosclerosis on prior CTA done in April 2018. Considering his high pretest probability of coronary artery disease, he will need further ischemic workup during this hospitalization. -Consult cardiology -Trend troponins -Patient already took aspirin prior to arrival. Continue aspirin 81 mg daily. -Lipid panel done in June 2018 showing LDL 78, HDL 131, and  triglycerides 52. No pharmacotherapy indicated at this time. -A1c pending   Acute kidney injury: BUN 48 and creatinine 1.9 on admission in the setting of poor oral intake. Creatinine was 1.6 two months ago. Prior baseline 0.9-1.0.  -Received 500 mL bolus of normal saline in the ED -Hold home ARB -Hold home loop diuretic -BMP in a.m. -Encourage by mouth intake  History of CHF: Echo done in January 2018 at [facility] demonstrated left ventricular ejection fraction 36-14% with diastolic dysfunction. He has trace pedal edema but lungs clear on exam and chest x-ray not suggestive of volume overload. -Hold home loop diuretic in the setting of AKI  Hypertension: Currently normotensive. -Hold home ARB in the setting of AKI  Macrocytic anemia: Hemoglobin 10.8 and MCV 101.9. Baseline hemoglobin between 10-11. B12 and folate levels were normal on prior labs done in June 2018. His chronic macrocytic anemia is likely due to bone marrow suppression in the setting of daily ethanol use. Also noted to have chronic mild thrombocytopenia with platelets 136,000 on recent labs. -No acute intervention needed at this time  Alcohol use disorder: Patient reports drinking 2 shots of whiskey daily. Denies any prior hospitalizations for alcohol withdrawal. -CIWA protocol -Thiamine, folic acid, and multivitamin  Tobacco use: He is currently smoking 1 pack of cigarettes in a week; was previously smoking 1 pack per day for 50-55 years. -Patient has declined nicotine patches as they have previously caused him to have nightmares. -Continue to discuss tobacco cessation  Diet: Heart healthy DVT prophylaxis: Subcutaneous heparin CODE STATUS: DO NOT RESUSCITATE. Confirmed with patient.   Dispo: Admit patient to Observation with expected length of stay less than 2 midnights.  SignedShela Leff, MD 03/06/2017, 11:23 AM  Pager: [phone]

## 2017-03-06 NOTE — Consult Note (Signed)
Cardiology Consultation:   Patient ID: Gabriel Stafford; [id number]; 1945/09/02   Admit date: 03/06/2017 Date of Consult: 03/06/2017  Primary Care Provider: System, Pcp Not In Primary Cardiologist: Dr. Tamala Julian Primary Electrophysiologist:     Patient Profile:   Gabriel Stafford is a 71 y.o. male with a hx of COPD on home O2, HTN, tobacco dependence, alcohol abuse, and chronic diastolic heart failure who is being seen today for the evaluation of chest pain at the request of Dr. Beryle Beams.  History of Present Illness:   Gabriel Stafford was last seen by this service 11/09/16 in consult for chest pain during a hospitalization for COPD exacerbation. Chest pain was discussed and it was decided that he should be treated for his COPD exacerbation and discharged and report for outpatient nuclear stress test. This was never completed.   He presented to [facility] again today for chest pain and shortness of breath. He was admitted for COPD exacerbation. Cardiology was asked to consult for 2 episodes of crushing substernal chest pain over the past week.  On my interview, he states he has had 3-5 bouts of chest pain over the past 2 weeks. The pain is substernal across his chest and feels like a tightness/pressure rated as a 10/10. The pain lasts 5-15 min and is relieved with time and rest. He states that everything hurts when he has trouble breathing. His legs swell when he does not elevate them at night. He reports associated diaphoresis, nausea, no vomiting, and palpitations. He denies having a stress test or heart cath in the past.   Past Medical History:  Diagnosis Date  . Abdominal distention   . Acute respiratory failure with hypoxemia ([place]) 09/27/2016  . Alcohol withdrawal syndrome without complication ([place])   . Chest discomfort 11/09/2016  . CHF (congestive heart failure) ([place])   . COPD (chronic obstructive pulmonary disease) ([place])   . COPD exacerbation ([place]) 09/27/2016  . Coronary artery calcification seen on  CAT scan 11/09/2016  . HTN (hypertension), benign 09/27/2016  . SIRS (systemic inflammatory response syndrome) ([place]) 11/08/2016    Past Surgical History:  Procedure Laterality Date  . CYST REMOVAL NECK       Home Medications:  Prior to Admission medications   Medication Sig Start Date End Date Taking? Authorizing Provider  albuterol (PROVENTIL HFA;VENTOLIN HFA) 108 (90 Base) MCG/ACT inhaler Inhale 2 puffs into the lungs every 6 (six) hours as needed for wheezing or shortness of breath.   Yes [provider]  ARNUITY ELLIPTA 200 MCG/ACT AEPB Take 1 puff by mouth every morning. 09/14/16  Yes [provider]  aspirin EC 81 MG EC tablet Take 1 tablet (81 mg total) by mouth daily. 11/12/16  Yes Diallo, Abdoulaye, MD  cyanocobalamin 500 MCG tablet Take 500 mcg by mouth daily.   Yes [provider]  folic acid (FOLVITE) 1 MG tablet Take 1 tablet (1 mg total) by mouth daily. 10/01/16  Yes Lavina Hamman, MD  ipratropium-albuterol (DUONEB) 0.5-2.5 (3) MG/3ML SOLN Take 3 mLs by nebulization every 4 (four) hours as needed. Patient taking differently: Take 3 mLs by nebulization every 4 (four) hours as needed (shortness of breath).  02/06/17  Yes Khatri, Hina, PA-C  losartan (COZAAR) 50 MG tablet Take 50 mg by mouth every morning. 09/15/16  Yes [provider]  potassium chloride SA (K-DUR,KLOR-CON) 20 MEQ tablet Take 2 tablets (40 mEq total) by mouth daily. 09/30/16  Yes Lavina Hamman, MD  STIOLTO RESPIMAT 2.5-2.5 MCG/ACT AERS Take 2  puffs by mouth every morning. 09/14/16  Yes [provider]  torsemide (DEMADEX) 20 MG tablet Take 2 tablets (40 mg total) by mouth daily. Patient taking differently: Take 20-40 mg by mouth See admin instructions. 40mg  in am, 20mg  in pm 11/11/16  Yes Diallo, Abdoulaye, MD  traZODone (DESYREL) 50 MG tablet Take 1 tablet (50 mg total) by mouth at bedtime. 11/11/16  Yes Diallo, Abdoulaye, MD    Inpatient Medications: Scheduled Meds: . [START  ON 03/07/2017] aspirin EC  81 mg Oral Daily  . [START ON 03/07/2017] cyanocobalamin  500 mcg Oral Daily  . folic acid  1 mg Oral Daily  . heparin  5,000 Units Subcutaneous Q8H  . ipratropium-albuterol  3 mL Nebulization Q4H  . multivitamin with minerals  1 tablet Oral Daily  . thiamine  100 mg Oral Daily   Or  . thiamine  100 mg Intravenous Daily   Continuous Infusions:  PRN Meds: ondansetron  Allergies:   No Known Allergies  Social History:   Social History   Social History  . Marital status: Married    Spouse name: N/A  . Number of children: N/A  . Years of education: N/A   Occupational History  . Not on file.   Social History Main Topics  . Smoking status: Current Every Day Smoker    Packs/day: 1.00    Types: Cigarettes  . Smokeless tobacco: Never Used  . Alcohol use 8.4 oz/week    14 Shots of liquor per week     Comment: Drinks 1/2 gallon every 2 days  . Drug use: No  . Sexual activity: Not Currently   Other Topics Concern  . Not on file   Social History Narrative  . No narrative on file    Family History:   Father had MI at age 5 and died of this. Mother had second CABG at age 38 and died of this. Brother has a history of CABG x3.  ROS:  Please see the history of present illness.  ROS  All other ROS reviewed and negative.     Physical Exam/Data:   Vitals:   03/06/17 1400 03/06/17 1415 03/06/17 1430 03/06/17 1445  BP: 134/66 (!) 142/82 (!) 144/83 (!) 144/85  Pulse: (!) 121 (!) 124 (!) 120 (!) 118  Resp:    20  Temp:      TempSrc:      SpO2: 93% 99% 97% 98%  Weight:      Height:        Intake/Output Summary (Last 24 hours) at 03/06/17 1455 Last data filed at 03/06/17 1446  Gross per 24 hour  Intake              500 ml  Output                0 ml  Net              500 ml   Filed Weights   03/06/17 0648  Weight: 200 lb (90.7 kg)   Body mass index is 29.53 kg/m.  General:  Well nourished, well developed, with increased work of  breathing HEENT: normal Neck: no JVD Vascular: No carotid bruits; FA pulses 2+ bilaterally without bruits  Cardiac:  normal S1, S2; RRR; no murmur, exam difficult 2/ lung sounds Lungs:  Increased work of breathing, scattered wheezes and rhonchi  Abd: soft, nontender, no hepatomegaly  Ext: 1+ edema Musculoskeletal:  No deformities, BUE and BLE strength normal and equal Skin:  warm and dry  Neuro:  CNs 2-12 intact, no focal abnormalities noted Psych:  Normal affect   EKG:  The EKG was personally reviewed and demonstrates:  Sinus tachcyardia with PVCs Telemetry:  Telemetry was personally reviewed and demonstrates:  Sinus tachycardia  Relevant CV Studies:  Echo 06/29/16 @ [place] The left ventricular size is normal. LV ejection fraction = 50-55%.  Left ventricular systolic function is low normal.  Left ventricular filling pattern is impaired. The right ventricle is normal in size and function. The right atrium is mildly dilated. There is no significant valvular stenosis or regurgitation There was insufficient TR detected to calculate RV systolic pressure. Estimated right atrial pressure is 5 mmHg.Marland Kitchen There is no pericardial effusion. There is no comparison study available.   Laboratory Data:  Chemistry Recent Labs Lab 03/06/17 0719 03/06/17 1148  NA 136  --   K 4.5  --   CL 93*  --   CO2 26  --   GLUCOSE 146*  --   BUN 48*  --   CREATININE 1.99* 2.16*  CALCIUM 9.6  --   GFRNONAA 32* 29*  GFRAA 37* 33*  ANIONGAP 17*  --     No results for input(s): PROT, ALBUMIN, AST, ALT, ALKPHOS, BILITOT in the last 168 hours. Hematology Recent Labs Lab 03/06/17 0719 03/06/17 1148  WBC 10.4 8.7  RBC 3.15* 3.11*  HGB 10.8* 10.5*  HCT 32.1* 31.9*  MCV 101.9* 102.6*  MCH 34.3* 33.8  MCHC 33.6 32.9  RDW 12.8 12.8  PLT 136* 136*   Cardiac Enzymes Recent Labs Lab 03/06/17 1148  TROPONINI 0.04*    Recent Labs Lab 03/06/17 0721  TROPIPOC 0.06    BNP Recent  Labs Lab 03/06/17 0719  BNP 89.2    DDimer No results for input(s): DDIMER in the last 168 hours.  Radiology/Studies:  Dg Chest 2 View  Result Date: 03/06/2017 CLINICAL DATA:  Shortness of breath. EXAM: CHEST  2 VIEW COMPARISON:  Radiographs of February 06, 2017. FINDINGS: The heart size and mediastinal contours are within normal limits. Atherosclerosis of thoracic aorta is noted. No pneumothorax or pleural effusion is noted. Both lungs are clear. The visualized skeletal structures are unremarkable. IMPRESSION: No active cardiopulmonary disease.  Aortic atherosclerosis. Electronically Signed   By: Marijo Conception, M.D.   On: 03/06/2017 07:25    Assessment and Plan:   1. Chest pain, shortness of breath - troponin 0.07 --> 0.04 - EKG without signs of acute ischemia - BNP 89.2, CXR without effusion/edema Pt has risk factors for ACS including obesity, HTN, current daily smoker, and family history (father, brother, mother had second CABG at age 61); he also has coronary calcifications seen on chest CT during a previous admission. He likely has at least nonosbructive disease. However, he is not a candidate for an ischemic evaluation while in an acute COPD exacerbation. Plan to treat COPD exacerbation and re-evaluate for possible myoview outpatient when stable. Echocardiogram at [facility] with low-normal LV function and diastolic dysfunction. Pt has some lower extremity edema on exam, but appears near euvolemic. Continue to cycle troponin. Cardiology will follow along.    2. Tachycardia - sinus tachycardia in the 100-140s on telemetry - this is likely secondary to excessive breathing treatments and steroids to manage his COPD exacerbation   3. Chronic diastolic heart failure, by echo at [facility] - BNP WNL and CXR negative for acute process - pt takes torsemide at home - consider IV lasix    4.  HTN - continue home med losartan    For questions or updates, please contact [place] Please consult [email or web address] for contact info under Cardiology/STEMI.   Signed, [place], PA  03/06/2017 2:55 PM

## 2017-03-06 NOTE — ED Notes (Signed)
Assited patient to bedside commode. Patient tachycardic at 150s patient very shakey and reports increased SOB. Patient back in bed now hr down to 130s.

## 2017-03-07 DIAGNOSIS — D509 Iron deficiency anemia, unspecified: Secondary | ICD-10-CM

## 2017-03-07 DIAGNOSIS — I110 Hypertensive heart disease with heart failure: Secondary | ICD-10-CM

## 2017-03-07 DIAGNOSIS — I209 Angina pectoris, unspecified: Secondary | ICD-10-CM

## 2017-03-07 DIAGNOSIS — R079 Chest pain, unspecified: Secondary | ICD-10-CM | POA: Diagnosis not present

## 2017-03-07 DIAGNOSIS — I509 Heart failure, unspecified: Secondary | ICD-10-CM

## 2017-03-07 DIAGNOSIS — F1010 Alcohol abuse, uncomplicated: Secondary | ICD-10-CM | POA: Diagnosis not present

## 2017-03-07 DIAGNOSIS — J441 Chronic obstructive pulmonary disease with (acute) exacerbation: Secondary | ICD-10-CM | POA: Diagnosis not present

## 2017-03-07 DIAGNOSIS — J449 Chronic obstructive pulmonary disease, unspecified: Secondary | ICD-10-CM | POA: Diagnosis not present

## 2017-03-07 DIAGNOSIS — N179 Acute kidney failure, unspecified: Secondary | ICD-10-CM

## 2017-03-07 LAB — BASIC METABOLIC PANEL
Anion gap: 11 (ref 5–15)
BUN: 57 mg/dL — ABNORMAL HIGH (ref 6–20)
CALCIUM: 9.6 mg/dL (ref 8.9–10.3)
CO2: 32 mmol/L (ref 22–32)
CREATININE: 1.9 mg/dL — AB (ref 0.61–1.24)
Chloride: 94 mmol/L — ABNORMAL LOW (ref 101–111)
GFR calc non Af Amer: 33 mL/min — ABNORMAL LOW (ref >60)
GFR, EST AFRICAN AMERICAN: 39 mL/min — AB (ref >60)
Glucose, Bld: 127 mg/dL — ABNORMAL HIGH (ref 65–99)
Potassium: 4.2 mmol/L (ref 3.5–5.1)
Sodium: 137 mmol/L (ref 135–145)

## 2017-03-07 LAB — TROPONIN I: TROPONIN I: 0.03 ng/mL — AB (ref <0.03)

## 2017-03-07 LAB — MAGNESIUM: Magnesium: 2.1 mg/dL (ref 1.7–2.4)

## 2017-03-07 MED ORDER — LEVALBUTEROL HCL 1.25 MG/0.5ML IN NEBU
1.2500 mg | INHALATION_SOLUTION | RESPIRATORY_TRACT | Status: DC
  Administered 2017-03-07 – 2017-03-08 (×7): 1.25 mg via RESPIRATORY_TRACT
  Filled 2017-03-07 (×7): qty 0.5

## 2017-03-07 MED ORDER — FLUTICASONE FUROATE-VILANTEROL 200-25 MCG/INH IN AEPB
1.0000 | INHALATION_SPRAY | Freq: Every day | RESPIRATORY_TRACT | Status: DC
  Administered 2017-03-07 – 2017-03-08 (×2): 1 via RESPIRATORY_TRACT
  Filled 2017-03-07: qty 28

## 2017-03-07 MED ORDER — TIOTROPIUM BROMIDE MONOHYDRATE 18 MCG IN CAPS
18.0000 ug | ORAL_CAPSULE | Freq: Every day | RESPIRATORY_TRACT | Status: DC
  Administered 2017-03-07 – 2017-03-08 (×2): 18 ug via RESPIRATORY_TRACT
  Filled 2017-03-07: qty 5

## 2017-03-07 NOTE — Care Management Obs Status (Signed)
[place] NOTIFICATION   Patient Details  Name: Gabriel Stafford MRN: [id number] Date of Birth: Nov 27, 1945   Medicare Observation Status Notification Given:  Yes    Carles Collet, RN 03/07/2017, 2:08 PM

## 2017-03-07 NOTE — Progress Notes (Addendum)
Progress Note  Patient Name: Gabriel Stafford Date of Encounter: 03/07/2017  Primary Cardiologist: new - Dr. Debara Pickett  Subjective   Pt appears more comfortable today compared to yesterday in the ED. He states he is breathing better and has had no further chest pain. He denies palpitations and dizziness/syncope.   Inpatient Medications    Scheduled Meds: . aspirin EC  81 mg Oral Daily  . cyanocobalamin  500 mcg Oral Daily  . folic acid  1 mg Oral Daily  . heparin  5,000 Units Subcutaneous Q8H  . ipratropium  0.5 mg Nebulization Q4H  . levalbuterol  1.25 mg Nebulization Q4H WA  . multivitamin with minerals  1 tablet Oral Daily  . predniSONE  40 mg Oral Q breakfast  . thiamine  100 mg Oral Daily   Or  . thiamine  100 mg Intravenous Daily   Continuous Infusions: . cefTRIAXone (ROCEPHIN)  IV Stopped (03/07/17 0038)   PRN Meds: ondansetron   Vital Signs    Vitals:   03/07/17 0400 03/07/17 0601 03/07/17 0618 03/07/17 0841  BP: 112/62  (!) 114/52 106/73  Pulse: (!) 111   (!) 125  Resp: 18   18  Temp: 98.1 F (36.7 C)   98.3 F (36.8 C)  TempSrc: Oral   Oral  SpO2: 96% 92%  98%  Weight: 207 lb 0.2 oz (93.9 kg)     Height:        Intake/Output Summary (Last 24 hours) at 03/07/17 0856 Last data filed at 03/07/17 0600  Gross per 24 hour  Intake              600 ml  Output              250 ml  Net              350 ml   Filed Weights   03/06/17 0648 03/07/17 0400  Weight: 200 lb (90.7 kg) 207 lb 0.2 oz (93.9 kg)     Physical Exam   General: Well developed, well nourished, male appearing in no acute distress. Head: Normocephalic, atraumatic.  Neck: Supple without bruits, JVD Lungs:  Resp regular, mildly labored, scattered wheezing throughout, diminished throughout Heart: Regular rhythm, tachycardic rate, S1, S2, no murmur; no rub. Abdomen: Soft, non-tender, non-distended with normoactive bowel sounds. No hepatomegaly. No rebound/guarding. No obvious abdominal  masses. Extremities: No clubbing, cyanosis, trace edema. Distal pedal pulses are 2+ bilaterally. Neuro: Alert and oriented X 3. Moves all extremities spontaneously. Psych: Normal affect.  Labs    Chemistry Recent Labs Lab 03/06/17 0719 03/06/17 1148 03/07/17 0549  NA 136  --  137  K 4.5  --  4.2  CL 93*  --  94*  CO2 26  --  32  GLUCOSE 146*  --  127*  BUN 48*  --  57*  CREATININE 1.99* 2.16* 1.90*  CALCIUM 9.6  --  9.6  GFRNONAA 32* 29* 33*  GFRAA 37* 33* 39*  ANIONGAP 17*  --  11     Hematology Recent Labs Lab 03/06/17 0719 03/06/17 1148  WBC 10.4 8.7  RBC 3.15* 3.11*  HGB 10.8* 10.5*  HCT 32.1* 31.9*  MCV 101.9* 102.6*  MCH 34.3* 33.8  MCHC 33.6 32.9  RDW 12.8 12.8  PLT 136* 136*    Cardiac Enzymes Recent Labs Lab 03/06/17 1148 03/06/17 1755 03/07/17 0017  TROPONINI 0.04* 0.04* 0.03*    Recent Labs Lab 03/06/17 0721  TROPIPOC 0.06  BNP Recent Labs Lab 03/06/17 0719  BNP 89.2     DDimer No results for input(s): DDIMER in the last 168 hours.   Radiology    Dg Chest 2 View  Result Date: 03/06/2017 CLINICAL DATA:  Shortness of breath. EXAM: CHEST  2 VIEW COMPARISON:  Radiographs of February 06, 2017. FINDINGS: The heart size and mediastinal contours are within normal limits. Atherosclerosis of thoracic aorta is noted. No pneumothorax or pleural effusion is noted. Both lungs are clear. The visualized skeletal structures are unremarkable. IMPRESSION: No active cardiopulmonary disease.  Aortic atherosclerosis. Electronically Signed   By: Marijo Conception, M.D.   On: 03/06/2017 07:25     Telemetry    Sinus tachycardia with frequent PVCs - Personally Reviewed  ECG    No new tracings - Personally Reviewed   Cardiac Studies   Echo 06/29/16 @ [place] [facility] The left ventricular size is normal. LV ejection fraction = 50-55%.  Left ventricular systolic function is low normal.  Left ventricular filling pattern is impaired. The right  ventricle is normal in size and function. The right atrium is mildly dilated. There is no significant valvular stenosis or regurgitation There was insufficient TR detected to calculate RV systolic pressure. Estimated right atrial pressure is 5 mmHg.Marland Kitchen There is no pericardial effusion. There is no comparison study available.  Patient Profile     71 y.o. male with a hx of COPD on home O2, HTN, tobacco dependence, alcohol abuse, and chronic diastolic heart failure was admitted for COPD exacerbation and we evaluted for chest pain.  Assessment & Plan    1. Chest pain - troponins have been minimally elevated and flat, consistent with demand ischemia in the setting of respiratory distress - EKG without ischemic changes - pt has no new chest pain complaints today - he states that he is breathing better and that he thought his chest pain over the past 2 weeks was related to his worsening respiratory status - as stated in consult note, we will not pursue ischemic evaluation during this hospitalization unless troponins sharply increase or EKG becomes acute - He would be a candidate for outpatient ischemic evaluation with a myoview once he improves from a respiratory standpoint   2. Sinus tachycardia, frequent PVCs - pt is unaware of his tachycardic rate and PVCs - will check Mg today  - K WNL - Will not change medications unless patient becomes symptomatic - he states he is always tachycardic, and this is likely related to required breathing treatments    3. AKI - sCr trending down 1.90 (2.16) - per primary team    Signed, Ledora Bottcher , PA-C 8:56 AM 03/07/2017 Pager: [phone]

## 2017-03-07 NOTE — Consult Note (Signed)
   [facility] CM Inpatient Consult   03/07/2017  Gabriel Stafford 03-08-1946 [id number]  Patient screened for potential [place] Management services for multiple admission with COPD exacerbation, COPD Gold. Patient assessed for [facility] Care Management services under patient's Medicare/ACO  Plan. Met with the patient at the bedside regarding post hospital follow up needs.  Patient endorses primary care provider as Charlott Holler at [place]. This primary care provider is listed to provide the transition of care calls and follow up.  Patient will receive COPD EMMI calls as well. Patient provided information on the calls with 24 hour nurse advise line magnet with contact information.  Patient appreciative of information.    Please place a [facility] Care Management consult or for questions contact:   Natividad Brood, RN BSN [facility] Liaison  [phone] business mobile phone Toll free office [phone]

## 2017-03-07 NOTE — Progress Notes (Signed)
Pt educated about safety and importance of bed alarm during the night however pt refuses to be on bed alarm. Will continue to round on patient.   Rakeisha Nyce, RN    

## 2017-03-07 NOTE — Progress Notes (Addendum)
Subjective: Pt denies any further chest pain and mentions relief since we switched up his inhalers as well as feeling less jittery.  He reports feeling less short of breath today.  Pt denies any new fevers, chills, dizziness and reports feeling well overall.    Objective:  Vital signs in last 24 hours: Vitals:   03/07/17 0841 03/07/17 0854 03/07/17 1214 03/07/17 1232  BP: 106/73   (!) 122/52  Pulse: (!) 125 99  (!) 115  Resp: 18 (!) 22  18  Temp: 98.3 F (36.8 C)   98.6 F (37 C)  TempSrc: Oral   Oral  SpO2: 98% 96% 97% 96%  Weight:      Height:       Lab Results  Component Value Date   TROPONINI 0.03 (HH) 03/07/2017   Troponins consistently borderline normal x3     Assessment/Plan:  Active Problems:   COPD exacerbation (HCC)  Acute COPD exacerbation: Patient has a history of GOLD stage IV COPD and bronchiectasis. He is chronically on 4-6 L of home oxygen at all times. He is being followed by pulmonology at [facility]. He is now presenting with worsening dyspnea for the past few weeks and was acutely short of breath in ED. Noted to have coarse breath sounds and diffuse end expiratory wheezing on exam. He did report having a cough productive of yellow sputum that started 03/06/17, however, no cough or sputum production in the preceding days.  Dyspnea less likely to be secondary to CHF as he does not appear to be grossly volume overloaded on exam. PE less likely as he does not have any pleuritic chest pain, calf pain/swelling, or history of recent travel.   -Supplemental oxygen. Keep oxygen saturation between 88-92%. -DuoNeb switched to atrovent and xopenex every four hours due to pt having symptoms on duoneb therapy -He has already received Solu-Medrol 80 mg IV once in the ED, switched to prednisone 40mg  daily -Influenza panel negative -began rocephin 03/06/17 due to pt having concerning symptoms of fever by EMS, new productive cough, chills -Continue discuss tobacco  cessation -Pulmonology follow-up as outpatient   Angina: Patient has experienced 2 episodes of severe substernal chest pain at rest in the past 1 week. Chest pain prior to admission accompanied by shortness of breath and diaphoresis. Point-of-care Troponin 0.06 on admission. EKG not suggestive of any acute ischemic changes. Although he does have significant risk factors for coronary artery disease including strong family history, hypertension, age, gender, and tobacco use. Noted to have coronary artery atherosclerosis on prior CTA done in April 2018. Considering his high pretest probability of coronary artery disease, he will need further ischemic workup.  -troponins borderline .0.4, 0.4, 0.3  -Patient already took aspirin prior to arrival. Continue aspirin 81 mg daily. -Lipid panel done in June 2018 showing LDL 78, HDL 131, and triglycerides 52. No pharmacotherapy indicated at this time. -A1c 5.5 -Cardiology consulted and feels chest pain may be related to demand ischemia in the setting of this COPD exacerbation, prefers to do myoview scan as an outpatient as pts chest pain has resolved when pts breathing improved.    Acute kidney injury: BUN 48 and creatinine 1.9 on admission in the setting of poor oral intake. Creatinine was 1.6 two months ago. Prior baseline 0.9-1.0.  -Received 500 mL bolus of normal saline in the ED -Hold home ARB -Hold home loop diuretic -creatinine trending down -Encourage by mouth intake   History of CHF: Echo done in January 2018  at [facility] demonstrated left ventricular ejection fraction 84-21% with diastolic dysfunction. He has trace pedal edema but lungs clear on exam and chest x-ray not suggestive of volume overload. -Hold home loop diuretic in the setting of AKI   Hypertension: Currently normotensive. -Hold home ARB in the setting of AKI   Macrocytic anemia: Hemoglobin 10.8 and MCV 101.9. Baseline hemoglobin between 10-11. B12 and folate levels were  normal on prior labs done in June 2018. His chronic macrocytic anemia is likely due to bone marrow suppression in the setting of daily ethanol use. Also noted to have chronic mild thrombocytopenia with platelets 136,000 on recent labs. -No acute intervention needed at this time -folate and B12 supplements continued   Alcohol use disorder: Patient reports drinking 2 shots of whiskey daily. Denies any prior hospitalizations for alcohol withdrawal. -CIWA protocol -Thiamine, folic acid, and multivitamin   Tobacco use: He is currently smoking 1 pack of cigarettes in a week; was previously smoking 1 pack per day for 50-55 years. -Patient has declined nicotine patches as they have previously caused him to have nightmares. -Continue to discuss tobacco cessation    Dispo: Anticipated discharge in approximately 1 day(s).   Katherine Roan, MD 03/07/2017, 2:58 PM Vickki Muff MD PGY-1 Internal Medicine Pager # [phone]

## 2017-03-08 DIAGNOSIS — J449 Chronic obstructive pulmonary disease, unspecified: Secondary | ICD-10-CM | POA: Diagnosis not present

## 2017-03-08 DIAGNOSIS — N179 Acute kidney failure, unspecified: Secondary | ICD-10-CM | POA: Diagnosis not present

## 2017-03-08 DIAGNOSIS — J441 Chronic obstructive pulmonary disease with (acute) exacerbation: Secondary | ICD-10-CM | POA: Diagnosis not present

## 2017-03-08 DIAGNOSIS — R079 Chest pain, unspecified: Secondary | ICD-10-CM | POA: Diagnosis not present

## 2017-03-08 LAB — BASIC METABOLIC PANEL
ANION GAP: 11 (ref 5–15)
BUN: 58 mg/dL — ABNORMAL HIGH (ref 6–20)
CALCIUM: 9.7 mg/dL (ref 8.9–10.3)
CHLORIDE: 93 mmol/L — AB (ref 101–111)
CO2: 33 mmol/L — AB (ref 22–32)
CREATININE: 1.57 mg/dL — AB (ref 0.61–1.24)
GFR calc non Af Amer: 42 mL/min — ABNORMAL LOW (ref >60)
GFR, EST AFRICAN AMERICAN: 49 mL/min — AB (ref >60)
Glucose, Bld: 106 mg/dL — ABNORMAL HIGH (ref 65–99)
Potassium: 3.7 mmol/L (ref 3.5–5.1)
SODIUM: 137 mmol/L (ref 135–145)

## 2017-03-08 MED ORDER — IPRATROPIUM BROMIDE 0.02 % IN SOLN: 0.5000 mg | RESPIRATORY_TRACT | 12 refills | Status: AC

## 2017-03-08 MED ORDER — PREDNISONE 20 MG PO TABS: 40.0000 mg | ORAL_TABLET | Freq: Every day | ORAL | 0 refills | Status: AC

## 2017-03-08 MED ORDER — LEVALBUTEROL HCL 1.25 MG/0.5ML IN NEBU: 1.2500 mg | INHALATION_SOLUTION | RESPIRATORY_TRACT | 12 refills | Status: AC

## 2017-03-08 MED ORDER — LEVALBUTEROL TARTRATE 45 MCG/ACT IN AERO: 2.0000 | INHALATION_SPRAY | RESPIRATORY_TRACT | 2 refills | Status: AC | PRN

## 2017-03-08 MED ORDER — AMOXICILLIN-POT CLAVULANATE 875-125 MG PO TABS: 1.0000 | ORAL_TABLET | Freq: Two times a day (BID) | ORAL | 0 refills | Status: AC

## 2017-03-08 NOTE — Discharge Summary (Signed)
Name: Gabriel Stafford MRN: [id number] DOB: 1946-02-02 71 y.o. PCP: System, Pcp Not In  Date of Admission: 03/06/2017  6:38 AM Date of Discharge: 03/08/2017 Attending Physician: Annia Belt, MD  Discharge Diagnosis: 1. COPD exacerbation 2. Chest pain 3. AKI    Active Problems:   COPD exacerbation [facility])   Discharge Medications: Allergies as of 03/08/2017   No Known Allergies     Medication List    STOP taking these medications   albuterol 108 (90 Base) MCG/ACT inhaler Commonly known as:  PROVENTIL HFA;VENTOLIN HFA   ipratropium-albuterol 0.5-2.5 (3) MG/3ML Soln Commonly known as:  DUONEB   potassium chloride SA 20 MEQ tablet Commonly known as:  K-DUR,KLOR-CON   torsemide 20 MG tablet Commonly known as:  DEMADEX     TAKE these medications   amoxicillin-clavulanate 875-125 MG tablet Commonly known as:  AUGMENTIN Take 1 tablet by mouth 2 (two) times daily.   ARNUITY ELLIPTA 200 MCG/ACT Aepb Generic drug:  Fluticasone Furoate Take 1 puff by mouth every morning.   aspirin 81 MG EC tablet Take 1 tablet (81 mg total) by mouth daily.   cyanocobalamin 500 MCG tablet Take 500 mcg by mouth daily.   folic acid 1 MG tablet Commonly known as:  FOLVITE Take 1 tablet (1 mg total) by mouth daily.   ipratropium 0.02 % nebulizer solution Commonly known as:  ATROVENT Take 2.5 mLs (0.5 mg total) by nebulization every 4 (four) hours.   levalbuterol 1.25 MG/0.5ML nebulizer solution Commonly known as:  XOPENEX Take 1.25 mg by nebulization every 4 (four) hours.   levalbuterol 45 MCG/ACT inhaler Commonly known as:  XOPENEX HFA Inhale 2 puffs into the lungs every 4 (four) hours as needed for wheezing.   losartan 50 MG tablet Commonly known as:  COZAAR Take 50 mg by mouth every morning.   predniSONE 20 MG tablet Commonly known as:  DELTASONE Take 2 tablets (40 mg total) by mouth daily with breakfast.   STIOLTO RESPIMAT 2.5-2.5 MCG/ACT Aers Generic drug:   Tiotropium Bromide-Olodaterol Take 2 puffs by mouth every morning.   traZODone 50 MG tablet Commonly known as:  DESYREL Take 1 tablet (50 mg total) by mouth at bedtime.       Disposition and follow-up:   GabrielAmos Stafford was discharged from [facility] in Stable condition.  At the hospital follow up visit please address:  1.   COPD exacerbation -follow up with pt about how his new inhaler regimen listed above has been doing, if he continues to have less side effects and continues to have stability in his shortness of breath on 4-6L O2 at home.   -verify pt has taken his full course of augmentin 875mg  and prednisone 40mg  -make sure pt has had no fever or continued productive cough with yellow sputum.     Chest Pain -Patient presented with history of 2 episodes of severe substernal chest pain at rest over the past week.  This chest pain was accompanied by shortness of breath and diaphoresis -cardiology was consulted and recommended further workup outpatient as pt was in acute COPD exacerbation that may have caused demand ischemia -recommend outpatient pharmacologic nuclear stress test  AKI -repeat BMP during next visit -assess volume status and bmp results and restart torsemide 20mg  if necessary   2.  Labs / imaging needed at time of follow-up: basic metabolic panel to evaluate kidney function  3.  Pending labs/ test needing follow-up: none  Follow-up Appointments: Follow-up Information  [facility] Follow up.   Specialty:  Hospice and Palliative Medicine Why:  They will do your home hospice care at your home Contact information: [place] [id number] [place] by problem list: Active Problems:   COPD exacerbation ([place])   1. COPD exacerbation Patient with history of gold stage IV COPD and bronchiectasis.  On 4-6 L of home oxygen at all times.  He presented to the emergency department with  worsening dyspnea for the past week and was acutely short of breath. On physical exam he was noted to have coarse breath sounds and diffuse expiratory wheezing.  He also reported a documented fever by EMS of 103 and was given Tylenol for that at the time.  He mentions the morning of his arrival at the ED he began to have a productive cough of thick yellow sputum that was new.  He also has a history of CHF however was not fluid overloaded on exam and there was no pulmonary edema that was revealed on chest x-ray only chronic emphysematous change.  He was started on DuoNeb therapy was given 1 dose of IV Solu-Medrol 80 mg.  He responded well to this therapy from a breathing standpoint however did have some side effects from the dual nebs mainly jitteriness and continued tightness and pain in his chest.  He was also tested for influenza which came back negative.  We began Rocephin that same evening and continued for 2 days.  We switched his breathing treatment to Xopenex and Atrovent which resulted in continued improvement in his breathing status and this time without the associated symptoms of jitteriness and tightness and pain in his chest.  He was also transitioned after the first dose of IV Solu-Medrol to prednisone 40 mg daily for a total of 5 days.  Once patient's breathing was stable and his chest pain resolved we transitioned him to Augmentin 875 mg twice a day for 3 more days.  Around the time of discharge patient was discussing hospice with [place] and he is very interested in this so we placed a referral.  Angina Patient was experiencing 2 episodes of severe substernal chest pain that occurred at rest over the past week. He had some chest pain prior to admission and that was accompanied by shortness of breath and diaphoresis. His initial troponin was 0.06 his EKG was not suggestive of any acute ischemic changes. He likely does have coronary artery disease given his significant risk factors like strong family  history, hypertension, age, gender, and tobacco abuse.  Additionally it was found that the patient has coronary artery atherosclerosis that was seen on a prior CTA that was done in April of this year.  Patient took aspirin prior to arrival we continued aspirin 81 mg daily here, we trended his troponins which remained borderline at 0.4, 0.4, 0.3.  A lipid panel done in June 2018 showed LDL of 78 HDL of 131 and triglycerides of 52  his A1c returned as 5.5. This combined with the resolution of his chest pain symptoms after his breathing improved May does feel that His risk of having an acute coronary event during this admission was  Low. This sentiment was reiterated by cardiology and further workup for this probable ischemic disease was recommended as an outpatient process.  Acute Kidney Injury Patient's BUN was 48 and creatinine was 1.9 on admission, he admits to poor oral intake of fluids. Creatinine was around  1.62 months ago. Prior to that his baseline was about 0.9-1.  He received 500 mL of normal saline in the ED.  We held his ARB and his torsemide during admission due to this AKi.  He never became fluid overloaded on exam he has a mild pedal edema which seems to be chronic but does not have any rales or further lower extremity edema and doesn't appear to have JVD as well.  On discharge we have chosen to discharge the patient on his home losartan 50 mg but hold his torsemide for the time being to be further evaluated by his primary care physician.    Discharge Vitals:   BP 122/80 (BP Location: Left Arm)   Pulse (!) 126   Temp 98 F (36.7 C) (Oral)   Resp 20   Ht 5\' 9"  (1.753 m)   Wt 200 lb (90.7 kg)   SpO2 95%   BMI 29.53 kg/m   Pertinent Labs, Studies, and Procedures:  CBC Latest Ref Rng & Units 03/06/2017 03/06/2017 02/06/2017  WBC 4.0 - 10.5 K/uL 8.7 10.4 7.8  Hemoglobin 13.0 - 17.0 g/dL 10.5(L) 10.8(L) 11.1(L)  Hematocrit 39.0 - 52.0 % 31.9(L) 32.1(L) 34.5(L)  Platelets 150 - 400 K/uL  136(L) 136(L) 167    BMP Latest Ref Rng & Units 03/08/2017 03/07/2017 03/06/2017  Glucose 65 - 99 mg/dL 106(H) 127(H) -  BUN 6 - 20 mg/dL 58(H) 57(H) -  Creatinine 0.61 - 1.24 mg/dL 1.57(H) 1.90(H) 2.16(H)  Sodium 135 - 145 mmol/L 137 137 -  Potassium 3.5 - 5.1 mmol/L 3.7 4.2 -  Chloride 101 - 111 mmol/L 93(L) 94(L) -  CO2 22 - 32 mmol/L 33(H) 32 -  Calcium 8.9 - 10.3 mg/dL 9.7 9.6 -   Lab Results  Component Value Date   TROPONINI 0.03 (HH) 03/07/2017    Influenza panel: neg  Lab Results  Component Value Date   HGBA1C 5.5 03/06/2017    BNP (last 3 results)  Recent Labs  11/08/16 0930 02/06/17 1302 03/06/17 0719  BNP 29.3 47.0 89.2        ProBNP (last 3 results) No results for input(s): PROBNP in the last 8760 hours.   Discharge Instructions: Discharge Instructions    Ambulatory referral to Hospice    Complete by:  As directed    End stage COPD   Call MD for:  difficulty breathing, headache or visual disturbances    Complete by:  As directed    Call MD for:  extreme fatigue    Complete by:  As directed    Call MD for:  hives    Complete by:  As directed    Call MD for:  persistant dizziness or light-headedness    Complete by:  As directed    Call MD for:  persistant nausea and vomiting    Complete by:  As directed    Call MD for:  redness, tenderness, or signs of infection (pain, swelling, redness, odor or green/yellow discharge around incision site)    Complete by:  As directed    Call MD for:  severe uncontrolled pain    Complete by:  As directed    Call MD for:  temperature >100.4    Complete by:  As directed    Diet - low sodium heart healthy    Complete by:  As directed    Discharge instructions    Complete by:  As directed    We have prescribed the inhalers that you preferred here in  the hospital due to less side effects the xopenex which gave you less side effects vs albuterol.  You will need close follow up with your primary care doctor in the  next week.  Take two more days of your prednisone with breakfast and take augmentin beginning tonight and every 12 hours until the pills are gone.  We placed a referral for hospice to come see you as we discussed, if you don't hear from them in the next week please call us and let us know at [phone].  You can also talk to your primary care doctor who can also request hospice.  Don't take any more torsemide for the next few days before you see your primary care doctor, talk with him about whether this medicine should be continued or not.   Increase activity slowly    Complete by:  As directed       Signed: Katherine Roan, MD 03/08/2017, 11:38 AM

## 2017-03-08 NOTE — Progress Notes (Signed)
Per CCMD patient HR rate went up to 160 bpm and after that went  back down to normal. Patient asymptomatic. Will continue to monitor.  Jarred Purtee, RN

## 2017-03-08 NOTE — Progress Notes (Addendum)
Subjective: Pt denies any further chest pain today much like yesterday and mentions relief since we switched up his inhalers as well as feeling less jittery.  He reports feeling less short of breath much like he did yesterday.  Pt denies any new fevers, chills, dizziness and reports feeling well overall. He understands his COPD is very severe and is interested in hospice referral.     Objective:  Vital signs in last 24 hours: Vitals:   03/08/17 0521 03/08/17 0524 03/08/17 0812 03/08/17 0839  BP:  122/80    Pulse:  (!) 127 (!) 126   Resp:   20   Temp:  98.2 F (36.8 C) 98 F (36.7 C)   TempSrc:  Oral Oral   SpO2:  98% 96% 95%  Weight: 200 lb (90.7 kg)     Height:       Lab Results  Component Value Date   TROPONINI 0.03 ([place]) 03/07/2017   Troponins consistently borderline normal x3   Physical Exam  Constitutional: He is oriented to person, place, and time. He appears well-developed and well-nourished.  Eyes: Right eye exhibits no discharge. Left eye exhibits no discharge. No scleral icterus.  Cardiovascular: Regular rhythm, normal heart sounds and intact distal pulses.  Tachycardia present.  Exam reveals no gallop and no friction rub.   No murmur heard. Pulmonary/Chest: No respiratory distress. He has wheezes (mild expiratory wheezes bilateral upper and lower lung fields). He has no rales.  Abdominal: Soft. Bowel sounds are normal. He exhibits no distension and no mass. There is no tenderness. There is no guarding.  Neurological: He is alert and oriented to person, place, and time.    Assessment/Plan:  Active Problems:   COPD exacerbation (HCC)  Acute COPD exacerbation: Patient has a history of GOLD stage IV COPD and bronchiectasis. He is chronically on 4-6 L of home oxygen at all times. He is being followed by pulmonology at [facility]. He is now presenting with worsening dyspnea for the past few weeks and was acutely short of breath in ED. Noted to have coarse  breath sounds and diffuse end expiratory wheezing on exam. He did report having a cough productive of yellow sputum that started 03/06/17, however, no cough or sputum production in the preceding days.  Dyspnea less likely to be secondary to CHF as he does not appear to be grossly volume overloaded on exam. PE less likely as he does not have any pleuritic chest pain, calf pain/swelling, or history of recent travel.   -Supplemental oxygen. Keep oxygen saturation between 88-92%. -DuoNeb switched to atrovent and xopenex every four hours due to pt having symptoms on duoneb therapy -He has already received Solu-Medrol 80 mg IV once in the ED, switched to prednisone 40mg  daily for total of 5 day course -Influenza panel negative -began rocephin 03/06/17 due to pt having concerning symptoms of fever by EMS, new productive cough, chills his last dose was 10/2.  Pt will begin augmentin 875mg  BID tonight for 3 days on discharge -Pulmonology/PCP follow-up as outpatient -Referred pt to hospice due to severity of his disease at pts request.   Angina: Patient has experienced 2 episodes of severe substernal chest pain at rest in the past 1 week. Chest pain prior to admission accompanied by shortness of breath and diaphoresis. Point-of-care Troponin 0.06 on admission. EKG not suggestive of any acute ischemic changes. Although he does have significant risk factors for coronary artery disease including strong family history, hypertension, age, gender, and tobacco  use. Noted to have coronary artery atherosclerosis on prior CTA done in April 2018. Considering his high pretest probability of coronary artery disease, he will need further ischemic workup.  -Pt has had no further chest pain since the day of admission, he feels it may have been related to his breathing/albuterol inhaler. -troponins borderline .0.4, 0.4, 0.3  -Patient already took aspirin prior to arrival. Continue aspirin 81 mg daily. -Lipid panel done in June  2018 showing LDL 78, HDL 131, and triglycerides 52. No pharmacotherapy indicated at this time. -A1c 5.5 -Cardiology consulted and feels chest pain may be related to demand ischemia in the setting of this COPD exacerbation, prefers to do myoview scan as an outpatient as pts chest pain has resolved when pts breathing improved.    Acute kidney injury: BUN 48 and creatinine 1.9 on admission in the setting of poor oral intake. Creatinine was 1.6 two months ago. Prior baseline 0.9-1.0.  -Received 500 mL bolus of normal saline in the ED -will add back ARB on discharge as AKI resolved  -continue to Hold home loop diuretic will have pt follow up with pcp -creatinine trending down was 1.57 today which appears to be his new baseline -Encourage by mouth intake   History of CHF: Echo done in January 2018 at [facility] demonstrated left ventricular ejection fraction 71-06% with diastolic dysfunction. He has trace pedal edema but lungs clear on exam and chest x-ray not suggestive of volume overload. -Hold home loop diuretic in the setting of AKI -continue to hold loop diuretic -pt has mild pedal edema but does not appear overtly volume overloaded on exam: no rales, minimal LE edema, no JVD   Hypertension: Currently normotensive. -will restart arb on discharge   Macrocytic anemia: Hemoglobin 10.8 and MCV 101.9. Baseline hemoglobin between 10-11. B12 and folate levels were normal on prior labs done in June 2018. His chronic macrocytic anemia is likely due to bone marrow suppression in the setting of daily ethanol use. Also noted to have chronic mild thrombocytopenia with platelets 136,000 on recent labs. -No acute intervention needed at this time -folate and B12 supplements continued   Alcohol use disorder: Patient reports drinking 2 shots of whiskey daily. Denies any prior hospitalizations for alcohol withdrawal. -CIWA protocol -Thiamine, folic acid, and multivitamin   Tobacco use: He is  currently smoking 1 pack of cigarettes in a week; was previously smoking 1 pack per day for 50-55 years. -Patient has declined nicotine patches as they have previously caused him to have nightmares. -Continue to discuss tobacco cessation    Dispo: Anticipated discharge afternoon of 03/08/17 with close PCP follow up, referral placed to hospice.   Katherine Roan, MD 03/08/2017, 9:57 AM Vickki Muff MD PGY-1 Internal Medicine Pager # [phone]

## 2017-03-08 NOTE — Care Management Note (Addendum)
Case Management Note  Patient Details  Name: Gabriel Stafford MRN: [id number] Date of Birth: 02-22-1946  Subjective/Objective:  COPD                 Action/Plan: CM talked to patient about home hospice choices, pt [facility]; referral placed as requested.  1:10  pm- Received [facility], they cannot accept the patient due to staffing issues; Home Hospice choice offered again, pt chose [facility]; Bambi with Hospice to see the patient. Mindi Slicker RN CM  1;58 pm- patient discharged prior to completion of Home Hospice arrangements. Bambi with [facility] called, she is to contact the patient at home. Aneta Mins [phone]  Expected Discharge Date:      Possibly 03/10/2017            Expected Discharge Plan:  Home w Hospice Care  Discharge planning Services  CM Consult  Post Acute Care Choice:    Choice offered to:  Patient  [facility] Agency:  Status of Service:  In process, will continue to follow  Sherrilyn Rist [phone] 03/08/2017, 11:00 AM

## 2017-03-08 NOTE — Progress Notes (Signed)
Medicine attending discharge note: I personally examined this patient on the day of planned discharge and I attest to the accuracy of the discharge evaluation and plan as recorded in the final progress note by resident physician Dr. Vickki Muff.  71 year old man with advanced oxygen dependent obstructive airway disease with almost 0 pulmonary reserve.  Minimal exertion with even eating a heavy meal resulting in respiratory distress.  He presented on the day of admission with an acute flareup but this time symptoms were associated with intense retrosternal chest pressure which woke him from sleep.  He had a similar episode about a week ago.  He does have suspected coronary artery disease based on a CT scan that shows coronary artery and aortic calcifications.  He is not felt to be a candidate for any aggressive intervention due to his inability to receive sedation and in view of chronic renal insufficiency with creatinines in the range of 1.6-2.0. Chest x-ray showed no acute infiltrate or effusion.  He had no fever or leukocytosis.  EKG showed sinus tachycardia with occasional PVCs.  No acute ischemic change. He did have borderline elevations of troponin cardiac enzymes with a peak point-of-care value of 0.06.  Normal BNP 89.  Venous values of 0.04/0 0.04/0.03.  He was seen in consultation by cardiology.  His symptoms were felt to be primarily related to his lung disease.  He was counseled to stop smoking.  If symptoms recur and his respiratory status is more stable, cardiology will consider outpatient stress testing.  He had significant tremulousness with albuterol.  He did much better with levalbuterol, Atrovent, and Spiriva.  He was put on a short course of steroids.  Condition reverted to his baseline.  Chest pressure resolved.  At discharge on 4 L nasal cannula oxygen oxygen saturation 95%.  Disposition: Condition stable at time of discharge There were no complications

## 2017-03-08 NOTE — Progress Notes (Signed)
Patient awake for most of the night, stating that he is not taking anything for sleep and that he is up during the night at home as well.  Patient agitated at some point during the night however, he calm down fast.  No other acute events.  Patient reports no pain this morning,stating that he feels much better. Will continue to monitor.  Claudie Brickhouse, RN

## 2017-04-06 DEATH — deceased

## 2018-12-12 IMAGING — CT CT ANGIO CHEST
2 of 6 series · 18 of 36 positions shown · IV contrast (isovue)
Comparison: Chest CT 09/27/2016

CLINICAL DATA: Congestive heart failure. Respiratory distress.
Short of breath and confusion.

EXAM:
CT ANGIOGRAPHY CHEST WITH CONTRAST
TECHNIQUE: Multidetector CT imaging of the chest was performed using the
standard protocol during bolus administration of intravenous
contrast. Multiplanar CT image reconstructions and MIPs were
obtained to evaluate the vascular anatomy.
CONTRAST:  80 cc Isovue

[Series 7: pe thins · axial · 0.85mm/px · z∈[+1131,+1427]mm · 17 of 334 slices shown]
[im 19/334  lung]
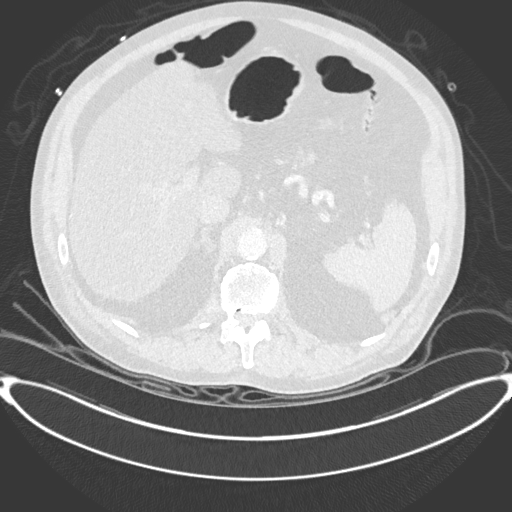
[im 38/334  mediastinal]
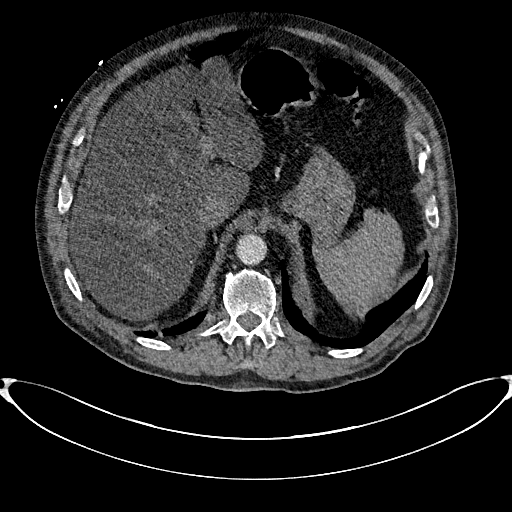
[im 56/334  lung]
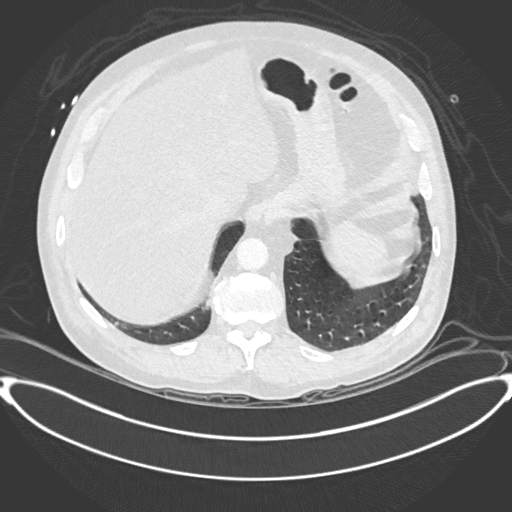
[im 75/334  mediastinal]
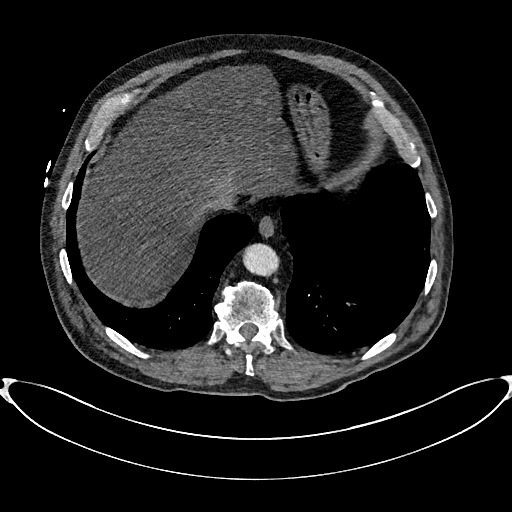
[im 93/334  lung]
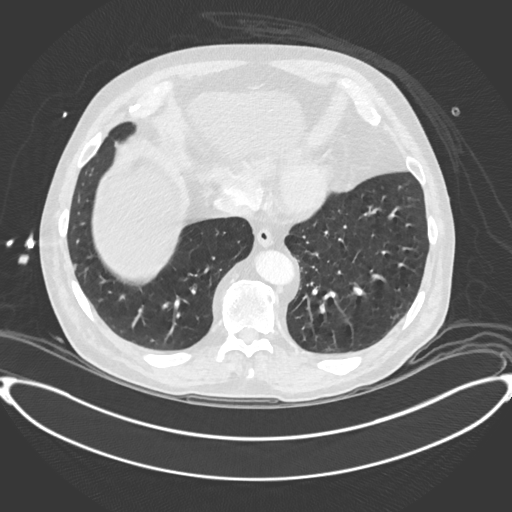
[im 112/334  mediastinal]
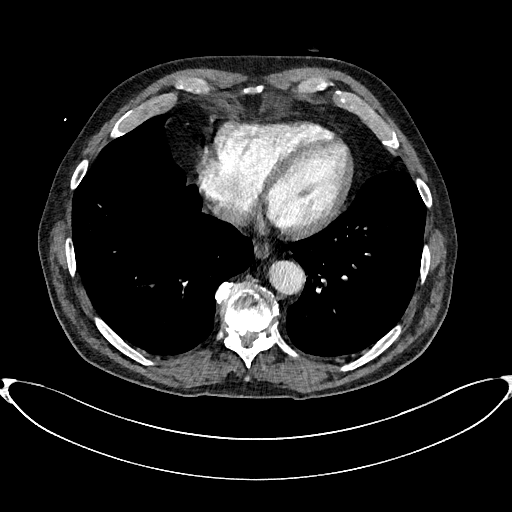
[im 130/334  lung]
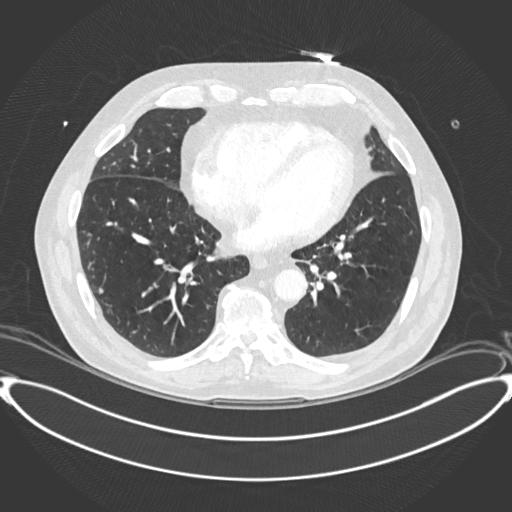
[im 149/334  mediastinal]
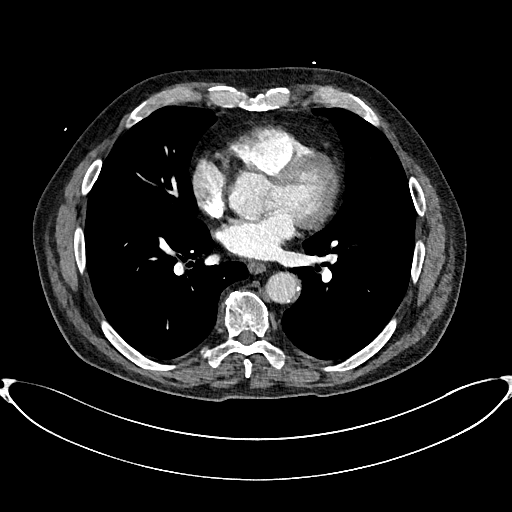
[im 167/334  lung]
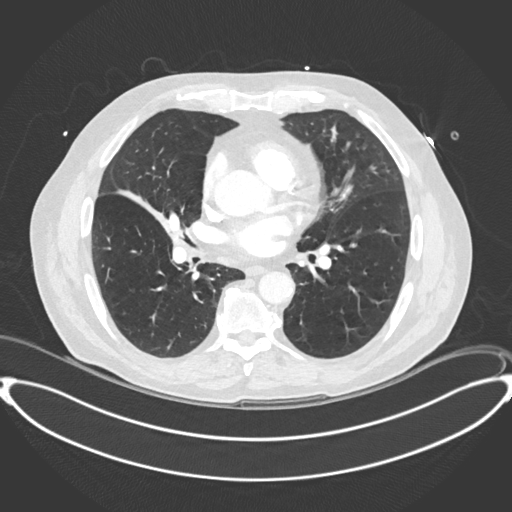
[im 186/334  mediastinal]
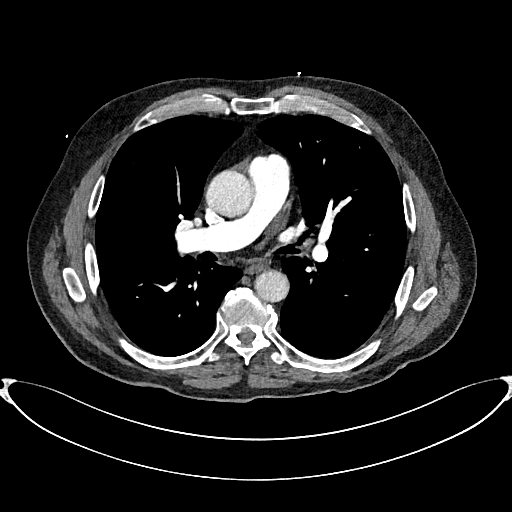
[im 204/334  lung]
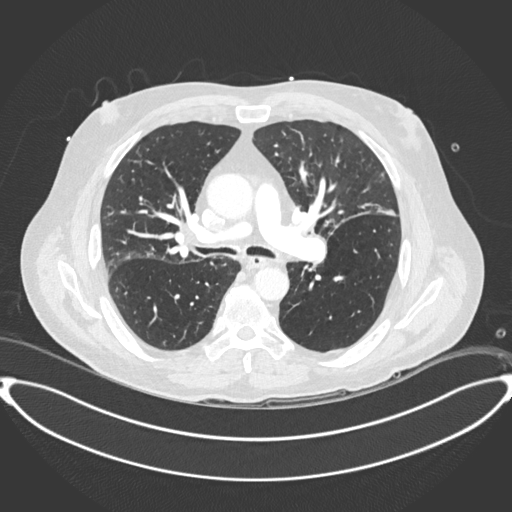
[im 223/334  mediastinal]
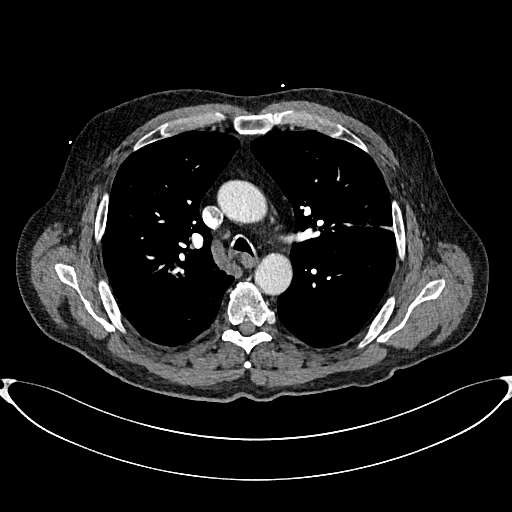
[im 241/334  lung]
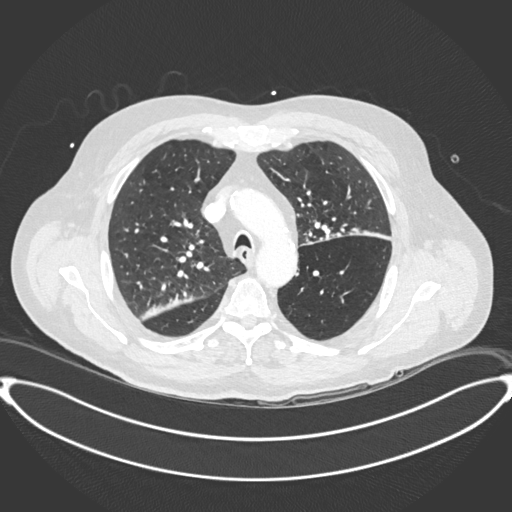
[im 260/334  mediastinal]
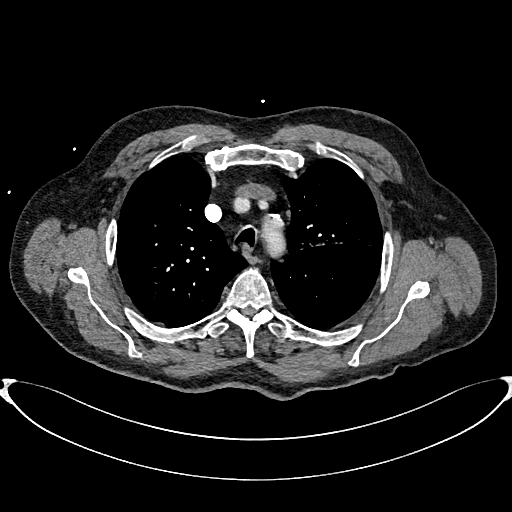
[im 278/334  lung]
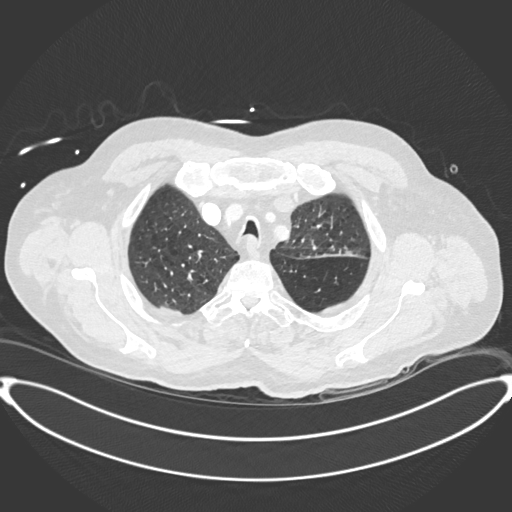
[im 297/334  mediastinal]
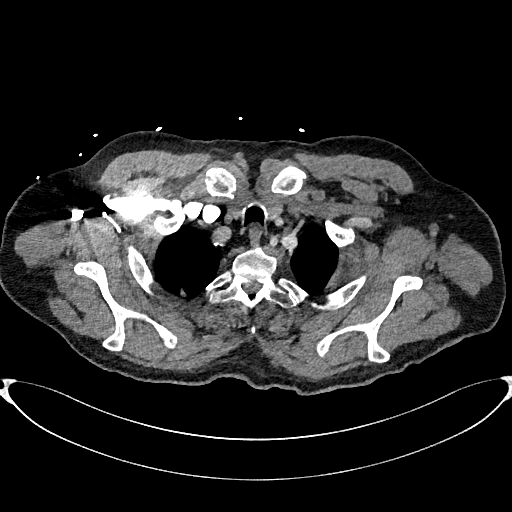
[im 315/334  lung]
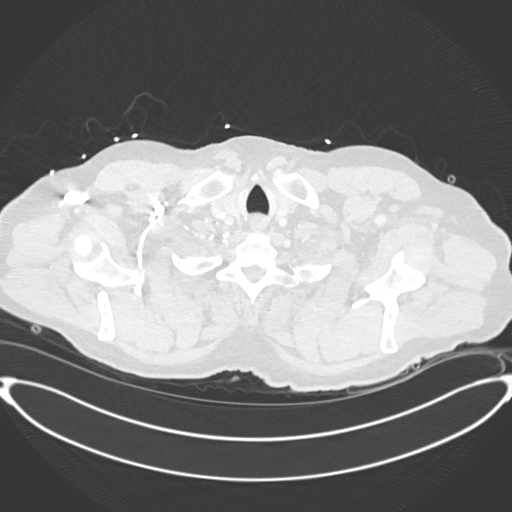

[Series 8: pe 2mm cor · coronal · 0.65mm/px · 1 of 160 slices shown]
[im 80/160  mediastinal]
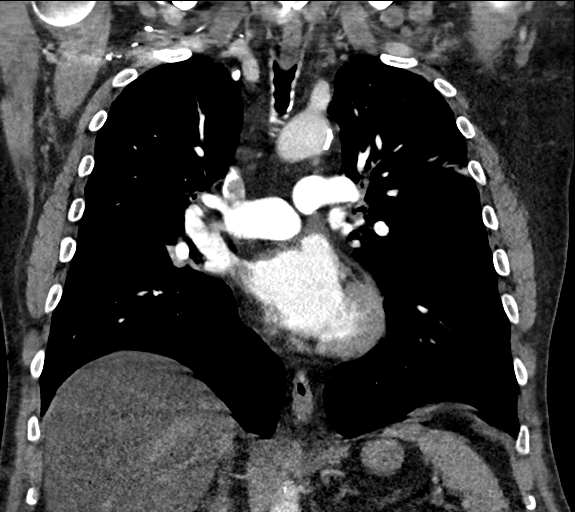

[18 of 36 positions shown; findings below may reference images not displayed]

FINDINGS: Cardiovascular: No filling defects within the pulmonary arteries
arteries to suggest acute pulmonary embolism. Coronary artery
calcification and aortic atherosclerotic calcification.

Mediastinum/Nodes: No axillary supraclavicular adenopathy. No
mediastinal hilar adenopathy. No pericardial fluid. Esophagus
normal.

Lungs/Pleura: Centrilobular emphysema in the upper lobes. Pleural
thickening along the LEFT and RIGHT oblique fissures.

No pulmonary infarction. No pulmonary edema, infiltrate, or
pneumothorax.

Mild linear thickening along the oblique fissures has increased
compared to most recent CT

Upper Abdomen: Low-attenuation liver may represent hepatic
steatosis.

Musculoskeletal: No aggressive osseous lesion.

Review of the MIP images confirms the above findings.
IMPRESSION: 1. No acute pulmonary embolism.
2. Pleural thickening along the oblique fissures is increased from
comparison exam.
3. No pulmonary infarction or pneumonia.
4. Hepatic steatosis

## 2019-03-12 IMAGING — DX DG CHEST 2V
2 series · 2 of 2 positions shown · non-contrast
Comparison: 12/30/2016

CLINICAL DATA: Pt states he has been having CP and SOB since
[REDACTED]. Hx CHF. Some swelling in bilateral legs. Pt states pain goes
into his shoulders as well. Pt very SOB while sitting.

EXAM:
CHEST - 2 VIEW

[chest lat]
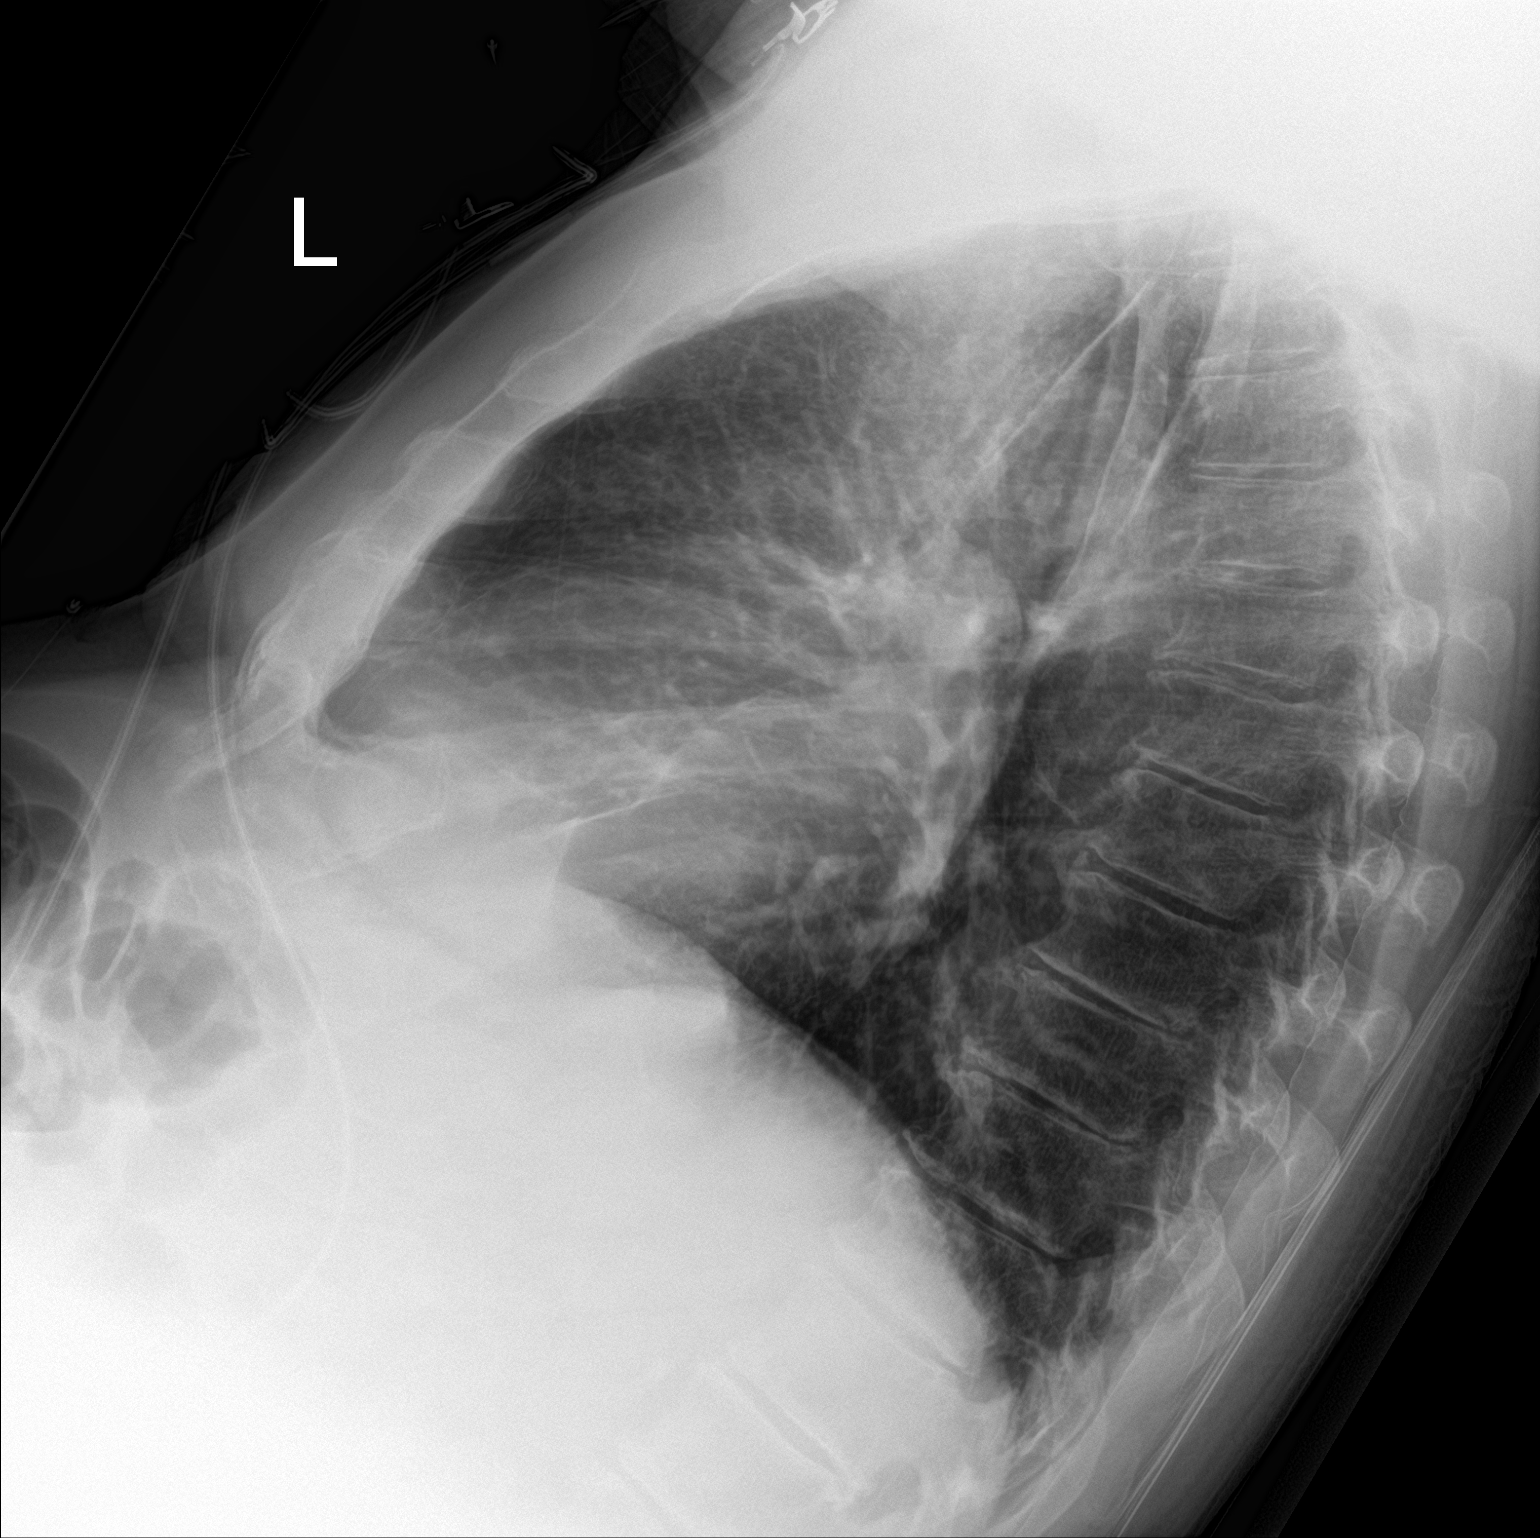

[chest ap]
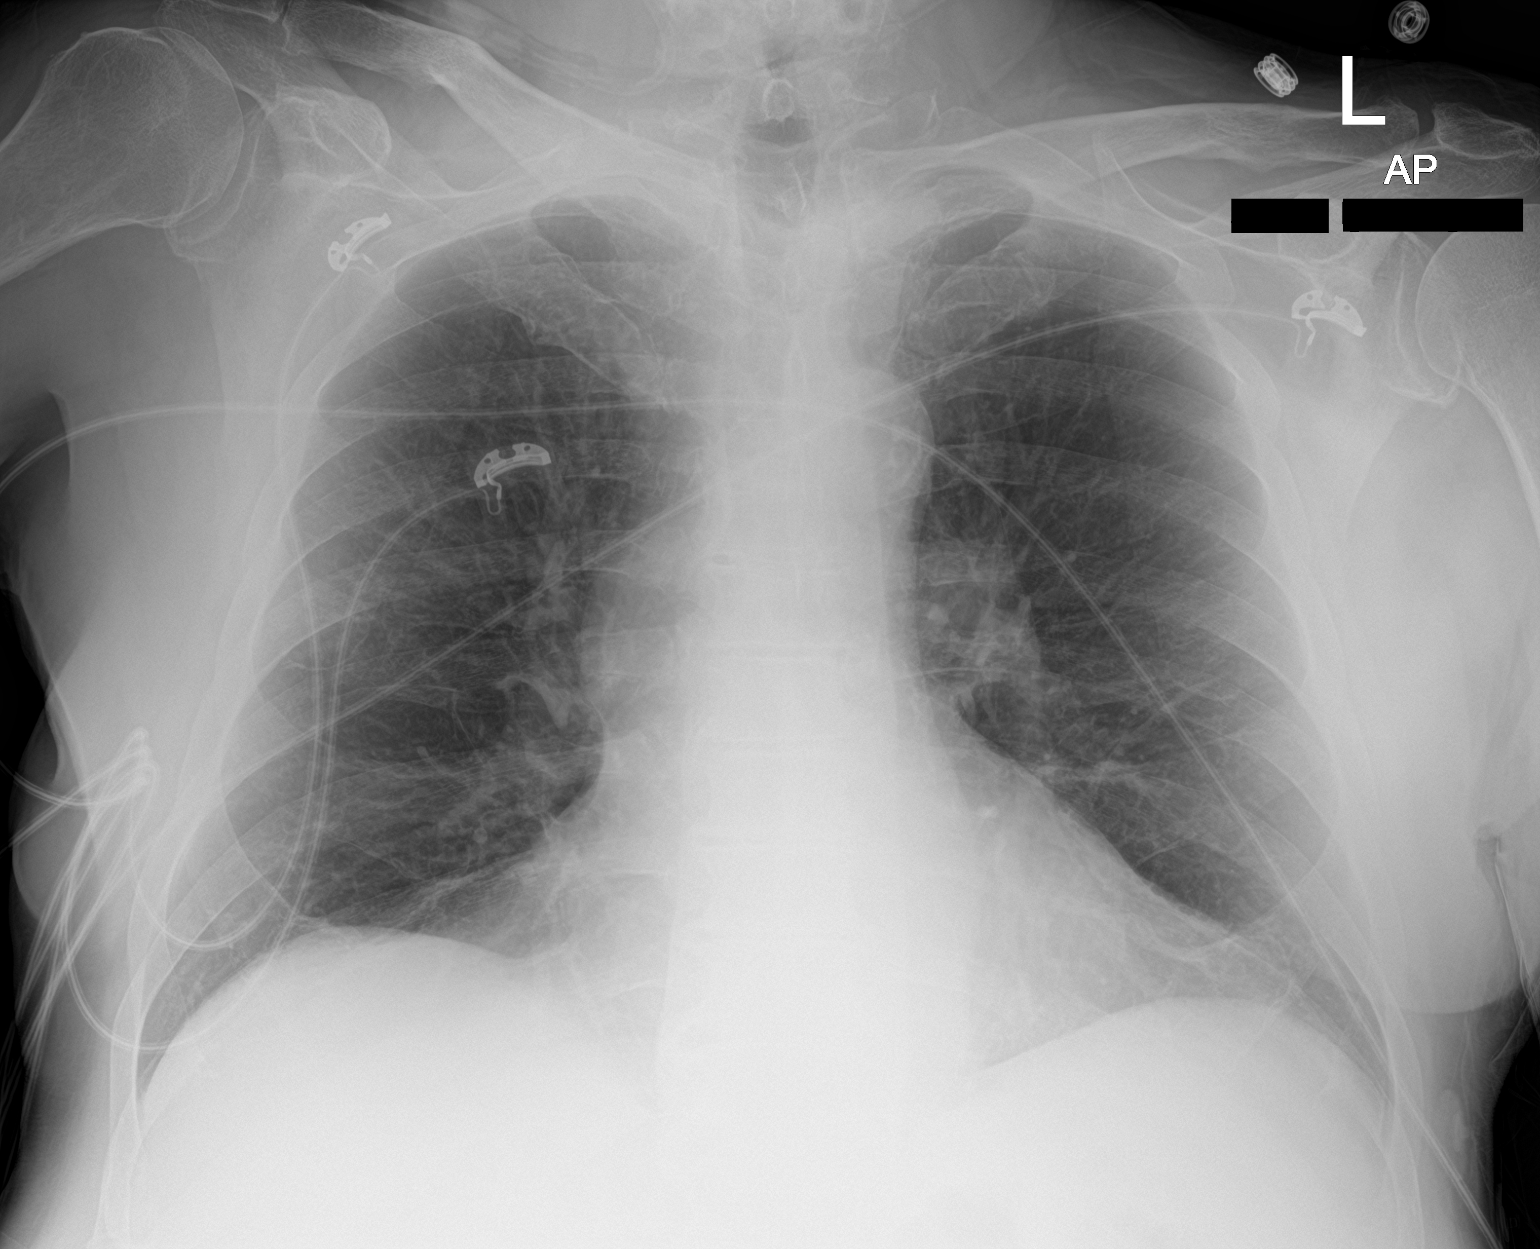

[2 of 2 positions shown; findings below may reference images not displayed]

FINDINGS: Relatively low lung volumes.  Lungs are clear.

Heart size and mediastinal contours are within normal limits.
Atheromatous aorta.

No effusion.  No pneumothorax.

Anterior vertebral endplate spurring at multiple levels in the mid
and lower thoracic spine.
IMPRESSION: No acute cardiopulmonary disease.
